# Patient Record
Sex: Female | Born: 1970 | Race: White | Hispanic: No | Marital: Married | Smoking: Former smoker
Health system: Southern US, Community
[De-identification: ages and names within clinical notes are randomized; demographics above are authoritative.]

## PROBLEM LIST (undated history)

## (undated) DIAGNOSIS — I2510 Atherosclerotic heart disease of native coronary artery without angina pectoris: Secondary | ICD-10-CM

## (undated) DIAGNOSIS — K219 Gastro-esophageal reflux disease without esophagitis: Secondary | ICD-10-CM

## (undated) DIAGNOSIS — G4733 Obstructive sleep apnea (adult) (pediatric): Principal | ICD-10-CM

## (undated) DIAGNOSIS — E785 Hyperlipidemia, unspecified: Secondary | ICD-10-CM

## (undated) HISTORY — DX: Hyperlipidemia, unspecified: E78.5

## (undated) HISTORY — DX: Morbid (severe) obesity due to excess calories: E66.01

## (undated) HISTORY — DX: Atherosclerotic heart disease of native coronary artery without angina pectoris: I25.10

## (undated) HISTORY — PX: OTHER SURGICAL HISTORY: SHX169

## (undated) HISTORY — DX: Obstructive sleep apnea (adult) (pediatric): G47.33

---

## 1997-12-05 ENCOUNTER — Other Ambulatory Visit: Admission: RE | Admit: 1997-12-05 | Discharge: 1997-12-05 | Payer: Self-pay | Admitting: Obstetrics and Gynecology

## 1999-04-29 ENCOUNTER — Other Ambulatory Visit: Admission: RE | Admit: 1999-04-29 | Discharge: 1999-04-29 | Payer: Self-pay | Admitting: Obstetrics and Gynecology

## 2000-05-04 ENCOUNTER — Other Ambulatory Visit: Admission: RE | Admit: 2000-05-04 | Discharge: 2000-05-04 | Payer: Self-pay | Admitting: Obstetrics and Gynecology

## 2000-05-22 ENCOUNTER — Emergency Department (HOSPITAL_COMMUNITY): Admission: EM | Admit: 2000-05-22 | Discharge: 2000-05-22 | Payer: Self-pay | Admitting: Emergency Medicine

## 2000-05-24 ENCOUNTER — Emergency Department (HOSPITAL_COMMUNITY): Admission: EM | Admit: 2000-05-24 | Discharge: 2000-05-24 | Payer: Self-pay | Admitting: Emergency Medicine

## 2000-08-25 ENCOUNTER — Encounter: Payer: Self-pay | Admitting: Family Medicine

## 2000-08-25 ENCOUNTER — Encounter: Admission: RE | Admit: 2000-08-25 | Discharge: 2000-08-25 | Payer: Self-pay | Admitting: Family Medicine

## 2001-08-29 ENCOUNTER — Other Ambulatory Visit: Admission: RE | Admit: 2001-08-29 | Discharge: 2001-08-29 | Payer: Self-pay | Admitting: Obstetrics and Gynecology

## 2002-02-25 ENCOUNTER — Emergency Department (HOSPITAL_COMMUNITY): Admission: EM | Admit: 2002-02-25 | Discharge: 2002-02-25 | Payer: Self-pay | Admitting: Emergency Medicine

## 2002-09-14 ENCOUNTER — Other Ambulatory Visit: Admission: RE | Admit: 2002-09-14 | Discharge: 2002-09-14 | Payer: Self-pay | Admitting: Obstetrics and Gynecology

## 2003-07-18 ENCOUNTER — Encounter: Admission: RE | Admit: 2003-07-18 | Discharge: 2003-07-18 | Payer: Self-pay | Admitting: Family Medicine

## 2003-10-23 ENCOUNTER — Other Ambulatory Visit: Admission: RE | Admit: 2003-10-23 | Discharge: 2003-10-23 | Payer: Self-pay | Admitting: Obstetrics and Gynecology

## 2004-10-24 ENCOUNTER — Other Ambulatory Visit: Admission: RE | Admit: 2004-10-24 | Discharge: 2004-10-24 | Payer: Self-pay | Admitting: Obstetrics and Gynecology

## 2005-12-03 ENCOUNTER — Other Ambulatory Visit: Admission: RE | Admit: 2005-12-03 | Discharge: 2005-12-03 | Payer: Self-pay | Admitting: Obstetrics and Gynecology

## 2010-04-04 ENCOUNTER — Encounter: Admission: RE | Admit: 2010-04-04 | Discharge: 2010-04-04 | Payer: Self-pay | Admitting: Otolaryngology

## 2012-09-08 ENCOUNTER — Encounter: Payer: Self-pay | Admitting: *Deleted

## 2012-09-08 ENCOUNTER — Other Ambulatory Visit: Payer: Self-pay | Admitting: *Deleted

## 2012-09-09 ENCOUNTER — Encounter: Payer: Self-pay | Admitting: Cardiovascular Disease

## 2012-09-09 ENCOUNTER — Ambulatory Visit (INDEPENDENT_AMBULATORY_CARE_PROVIDER_SITE_OTHER): Payer: BC Managed Care – PPO | Admitting: Cardiovascular Disease

## 2012-09-09 VITALS — BP 120/82 | HR 99 | Ht 64.0 in | Wt 190.0 lb

## 2012-09-09 DIAGNOSIS — R0600 Dyspnea, unspecified: Secondary | ICD-10-CM | POA: Insufficient documentation

## 2012-09-09 DIAGNOSIS — R0609 Other forms of dyspnea: Secondary | ICD-10-CM

## 2012-09-09 DIAGNOSIS — R079 Chest pain, unspecified: Secondary | ICD-10-CM

## 2012-09-09 DIAGNOSIS — R0789 Other chest pain: Secondary | ICD-10-CM

## 2012-09-09 NOTE — Progress Notes (Signed)
    [name] Date of Birth  02-14-1971       [facility]    [place] [address], [address]  [address] [place], [place]  [address]   [place], [place]  [address] [phone]     [phone]   Fax  [phone]    Fax [phone]  Problem List: 1. Chest pain 2.  Hyperlipidemia   History of Present Illness:  Debroah is a 42 yo with a strong family hx of CAD.  She has had some vague chest pain - lasts a week or so .  She also has some breast tenderness.   She does not get any exercise.  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    She does go hunting occasionally with her husband and never has any problems walking miles.   She denies any dyspnea.   She smokes a ppd.   She is interested in getting an Teaching laboratory technician.    She has occasional episodes of nausea and abdominal pain - she thinks she may have gall bladder problems.    Current Outpatient Prescriptions on File Prior to Visit  Medication Sig Dispense Refill  . fluticasone (FLONASE) 50 MCG/ACT nasal spray Place 1 spray into the nose daily.      Marland Kitchen NEXIUM 40 MG capsule Take 1 tablet daily in the morning      . simvastatin (ZOCOR) 40 MG tablet Take 1 tablet by mouth daily        No Known Allergies  Past Medical History  Diagnosis Date  . Hyperlipidemia     No past surgical history on file.  History  Smoking status  . Current Some Day Smoker  Smokeless tobacco  . Not on file    History  Alcohol Use No    No family history on file.  Reviw of Systems:  Reviewed in the HPI.  All other systems are negative.  Physical Exam: Blood pressure 120/82, pulse 99, height 5\' 4"  (1.626 m), weight 190 lb (86.183 kg), SpO2 98.00%.  General: Well developed, well nourished, in no acute distress.  Head: Normocephalic, atraumatic, sclera non-icteric, mucus membranes are moist,   Neck: Supple. Carotids are 2 + without bruits. No JVD   Lungs: Clear   Heart: RR,  normal S1, S2  Abdomen: Soft, non-tender, non-distended with normal bowel sounds.  Msk:  Strength and tone are normal   Extremities: No clubbing or cyanosis. No edema.  Distal pedal pulses are 2+ and equal    Neuro: CN II - XII intact.  Alert and oriented X 3.   Psych:  Normal   ECG: 09/09/2012: Normal sinus rhythm at 90 beats a minute. She has low voltage. She has nonspecific T-wave abnormalities.  Assessment / Plan:

## 2012-09-09 NOTE — Patient Instructions (Addendum)
Your physician recommends that you schedule a follow-up appointment in: As Needed  Your physician has requested that you have an echocardiogram. Echocardiography is a painless test that uses sound waves to create images of your heart. It provides your doctor with information about the size and shape of your heart and how well your heart's chambers and valves are working. This procedure takes approximately one hour. There are no restrictions for this procedure.  Calorie Counting Diet A calorie counting diet requires you to eat the number of calories that are right for you in a day. Calories are the measurement of how much energy you get from the food you eat. Eating the right amount of calories is important for staying at a healthy weight. If you eat too many calories, your body will store them as fat and you may gain weight. If you eat too few calories, you may lose weight. Counting the number of calories you eat during a day will help you know if you are eating the right amount. A Registered Dietitian can determine how many calories you need in a day. The amount of calories needed varies from person to person. If your goal is to lose weight, you will need to eat fewer calories. Losing weight can benefit you if you are overweight or have health problems such as heart disease, high blood pressure, or diabetes. If your goal is to gain weight, you will need to eat more calories. Gaining weight may be necessary if you have a certain health problem that causes your body to need more energy. TIPS Whether you are increasing or decreasing the number of calories you eat during a day, it may be hard to get used to changes in what you eat and drink. The following are tips to help you keep track of the number of calories you eat.  Measure foods at home with measuring cups. This helps you know the amount of food and number of calories you are eating.  Restaurants often serve food in amounts that are larger than 1  serving. While eating out, estimate how many servings of a food you are given. For example, a serving of cooked rice is  cup or about the size of half of a fist. Knowing serving sizes will help you be aware of how much food you are eating at restaurants.  Ask for smaller portion sizes or child-size portions at restaurants.  Plan to eat half of a meal at a restaurant. Take the rest home or share the other half with a friend.  Read the Nutrition Facts panel on food labels for calorie content and serving size. You can find out how many servings are in a package, the size of a serving, and the number of calories each serving has.  For example, a package might contain 3 cookies. The Nutrition Facts panel on that package says that 1 serving is 1 cookie. Below that, it will say there are 3 servings in the container. The calories section of the Nutrition Facts label says there are 90 calories. This means there are 90 calories in 1 cookie (1 serving). If you eat 1 cookie you have eaten 90 calories. If you eat all 3 cookies, you have eaten 270 calories (3 servings x 90 calories = 270 calories). The list below tells you how big or small some common portion sizes are.  1 oz.........4 stacked dice.  3 oz........Marland KitchenDeck of cards.  1 tsp.......Marland KitchenTip of little finger.  1 tbs......Marland KitchenMarland KitchenThumb.  2 tbs.......Marland KitchenGolf ball.  cup......Marland KitchenHalf of a fist.  1 cup.......Marland KitchenA fist. KEEP A FOOD LOG Write down every food item you eat, the amount you eat, and the number of calories in each food you eat during the day. At the end of the day, you can add up the total number of calories you have eaten. It may help to keep a list like the one below. Find out the calorie information by reading the Nutrition Facts panel on food labels. Breakfast  Bran cereal (1 cup, 110 calories).  Fat-free milk ( cup, 45 calories). Snack  Apple (1 medium, 80 calories). Lunch  Spinach (1 cup, 20 calories).  Tomato ( medium, 20  calories).  Chicken breast strips (3 oz, 165 calories).  Shredded cheddar cheese ( cup, 110 calories).  Light [place] dressing (2 tbs, 60 calories).  Whole-wheat bread (1 slice, 80 calories).  Tub margarine (1 tsp, 35 calories).  Vegetable soup (1 cup, 160 calories). Dinner  Pork chop (3 oz, 190 calories).  Brown rice (1 cup, 215 calories).  Steamed broccoli ( cup, 20 calories).  Strawberries (1  cup, 65 calories).  Whipped cream (1 tbs, 50 calories). Daily Calorie Total: 1425 Document Released: 08/10/2005 Document Revised: 11/02/2011 Document Reviewed: 02/04/2007 [facility] Patient Information 2013 [place], [place].   Exercise to Lose Weight Exercise and a healthy diet may help you lose weight. Your doctor may suggest specific exercises. EXERCISE IDEAS AND TIPS  Choose low-cost things you enjoy doing, such as walking, bicycling, or exercising to workout videos.  Take stairs instead of the elevator.  Walk during your lunch break.  Park your car further away from work or school.  Go to a gym or an exercise class.  Start with 5 to 10 minutes of exercise each day. Build up to 30 minutes of exercise 4 to 6 days a week.  Wear shoes with good support and comfortable clothes.  Stretch before and after working out.  Work out until you breathe harder and your heart beats faster.  Drink extra water when you exercise.  Do not do so much that you hurt yourself, feel dizzy, or get very short of breath. Exercises that burn about 150 calories:  Running 1  miles in 15 minutes.  Playing volleyball for 45 to 60 minutes.  Washing and waxing a car for 45 to 60 minutes.  Playing touch football for 45 minutes.  Walking 1  miles in 35 minutes.  Pushing a stroller 1  miles in 30 minutes.  Playing basketball for 30 minutes.  Raking leaves for 30 minutes.  Bicycling 5 miles in 30 minutes.  Walking 2 miles in 30 minutes.  Dancing for 30 minutes.  Shoveling snow  for 15 minutes.  Swimming laps for 20 minutes.  Walking up stairs for 15 minutes.  Bicycling 4 miles in 15 minutes.  Gardening for 30 to 45 minutes.  Jumping rope for 15 minutes.  Washing windows or floors for 45 to 60 minutes. Document Released: 09/12/2010 Document Revised: 11/02/2011 Document Reviewed: 09/12/2010 [facility] Patient Information 2013 [place], [place].

## 2012-09-09 NOTE — Assessment & Plan Note (Signed)
Adrienne [name] presents with some atypical episodes episodes of chest wall pain. She has thought that it may be due to her breasts. She has occasional episodes of chest pain at rest but she never has episodes of pain when she is walking or hiking. She does deer hunting with her husband on occasion and never has any problems with chest pain.  Her EKG is basically unremarkable. I don't think that she has ischemic heart disease.  I have advised her to stop smoking as this will help reduce the risk of any further problems. Her medical Dr. will be measuring her cholesterol a regular basis

## 2012-09-09 NOTE — Assessment & Plan Note (Signed)
She presents with some episodes of dyspnea. I think that an echocardiogram may be helpful in evaluating her left ventricular size and function.  We will not schedule her a return appointment but will be happy to see her again in the future if needed.

## 2012-09-16 ENCOUNTER — Ambulatory Visit (HOSPITAL_COMMUNITY): Payer: BC Managed Care – PPO | Attending: Cardiology | Admitting: Radiology

## 2012-09-16 DIAGNOSIS — R072 Precordial pain: Secondary | ICD-10-CM

## 2012-09-16 DIAGNOSIS — R0600 Dyspnea, unspecified: Secondary | ICD-10-CM

## 2012-09-16 DIAGNOSIS — F17200 Nicotine dependence, unspecified, uncomplicated: Secondary | ICD-10-CM | POA: Insufficient documentation

## 2012-09-16 DIAGNOSIS — R079 Chest pain, unspecified: Secondary | ICD-10-CM

## 2012-09-16 NOTE — Progress Notes (Signed)
Echocardiogram performed.  

## 2017-11-24 ENCOUNTER — Other Ambulatory Visit: Payer: Self-pay | Admitting: Nurse Practitioner

## 2017-11-24 DIAGNOSIS — Z1231 Encounter for screening mammogram for malignant neoplasm of breast: Secondary | ICD-10-CM

## 2017-12-16 ENCOUNTER — Ambulatory Visit: Payer: Self-pay

## 2017-12-20 ENCOUNTER — Ambulatory Visit
Admission: RE | Admit: 2017-12-20 | Discharge: 2017-12-20 | Disposition: A | Discharge status: Home / Self Care | Payer: BLUE CROSS/BLUE SHIELD | Source: Ambulatory Visit | Attending: Nurse Practitioner | Admitting: Nurse Practitioner

## 2017-12-20 DIAGNOSIS — Z1231 Encounter for screening mammogram for malignant neoplasm of breast: Secondary | ICD-10-CM

## 2018-02-22 ENCOUNTER — Emergency Department (HOSPITAL_COMMUNITY): Payer: BLUE CROSS/BLUE SHIELD

## 2018-02-22 ENCOUNTER — Encounter (HOSPITAL_COMMUNITY): Payer: Self-pay | Admitting: Emergency Medicine

## 2018-02-22 ENCOUNTER — Other Ambulatory Visit: Payer: Self-pay

## 2018-02-22 ENCOUNTER — Inpatient Hospital Stay (HOSPITAL_COMMUNITY)
Admission: EM | Admit: 2018-02-22 | Discharge: 2018-02-26 | DRG: 247 | Disposition: A | Discharge status: Home / Self Care | Payer: BLUE CROSS/BLUE SHIELD | Attending: Internal Medicine | Admitting: Internal Medicine

## 2018-02-22 DIAGNOSIS — Z8249 Family history of ischemic heart disease and other diseases of the circulatory system: Secondary | ICD-10-CM

## 2018-02-22 DIAGNOSIS — F17200 Nicotine dependence, unspecified, uncomplicated: Secondary | ICD-10-CM | POA: Diagnosis present

## 2018-02-22 DIAGNOSIS — I214 Non-ST elevation (NSTEMI) myocardial infarction: Secondary | ICD-10-CM

## 2018-02-22 DIAGNOSIS — I2510 Atherosclerotic heart disease of native coronary artery without angina pectoris: Secondary | ICD-10-CM | POA: Diagnosis present

## 2018-02-22 DIAGNOSIS — Z7982 Long term (current) use of aspirin: Secondary | ICD-10-CM

## 2018-02-22 DIAGNOSIS — R079 Chest pain, unspecified: Secondary | ICD-10-CM | POA: Diagnosis present

## 2018-02-22 DIAGNOSIS — Z955 Presence of coronary angioplasty implant and graft: Secondary | ICD-10-CM

## 2018-02-22 DIAGNOSIS — R072 Precordial pain: Secondary | ICD-10-CM | POA: Diagnosis not present

## 2018-02-22 DIAGNOSIS — Z881 Allergy status to other antibiotic agents status: Secondary | ICD-10-CM

## 2018-02-22 DIAGNOSIS — Z880 Allergy status to penicillin: Secondary | ICD-10-CM

## 2018-02-22 DIAGNOSIS — E785 Hyperlipidemia, unspecified: Secondary | ICD-10-CM | POA: Diagnosis present

## 2018-02-22 DIAGNOSIS — E876 Hypokalemia: Secondary | ICD-10-CM | POA: Diagnosis present

## 2018-02-22 DIAGNOSIS — Z7951 Long term (current) use of inhaled steroids: Secondary | ICD-10-CM

## 2018-02-22 DIAGNOSIS — K219 Gastro-esophageal reflux disease without esophagitis: Secondary | ICD-10-CM | POA: Diagnosis present

## 2018-02-22 DIAGNOSIS — Z79899 Other long term (current) drug therapy: Secondary | ICD-10-CM

## 2018-02-22 DIAGNOSIS — I249 Acute ischemic heart disease, unspecified: Secondary | ICD-10-CM

## 2018-02-22 DIAGNOSIS — Z91040 Latex allergy status: Secondary | ICD-10-CM

## 2018-02-22 DIAGNOSIS — Z6841 Body Mass Index (BMI) 40.0 and over, adult: Secondary | ICD-10-CM

## 2018-02-22 HISTORY — DX: Gastro-esophageal reflux disease without esophagitis: K21.9

## 2018-02-22 HISTORY — DX: Atherosclerotic heart disease of native coronary artery without angina pectoris: I25.10

## 2018-02-22 LAB — I-STAT TROPONIN, ED: TROPONIN I, POC: 0.08 ng/mL (ref 0.00–0.08)

## 2018-02-22 LAB — I-STAT BETA HCG BLOOD, ED (MC, WL, AP ONLY): I-stat hCG, quantitative: <5 m[IU]/mL (ref <5)

## 2018-02-22 NOTE — ED Triage Notes (Signed)
Pt arrived via EMS with c/o chest pain x 4 hours tonight. Similar episode Sunday after a "heated discussion" with her husband. Pt hx GERD, took xanax and gerd meds PTA as well as aspirin, refused NTG. VSS.

## 2018-02-22 NOTE — ED Notes (Signed)
Patient transported to X-ray 

## 2018-02-23 ENCOUNTER — Inpatient Hospital Stay (HOSPITAL_COMMUNITY)
Admission: EM | Disposition: A | Discharge status: Home / Self Care | Payer: Self-pay | Source: Home / Self Care | Attending: Internal Medicine

## 2018-02-23 ENCOUNTER — Other Ambulatory Visit: Payer: Self-pay

## 2018-02-23 ENCOUNTER — Observation Stay (HOSPITAL_BASED_OUTPATIENT_CLINIC_OR_DEPARTMENT_OTHER): Payer: BLUE CROSS/BLUE SHIELD

## 2018-02-23 ENCOUNTER — Encounter (HOSPITAL_COMMUNITY): Payer: Self-pay | Admitting: Internal Medicine

## 2018-02-23 DIAGNOSIS — I2510 Atherosclerotic heart disease of native coronary artery without angina pectoris: Secondary | ICD-10-CM | POA: Diagnosis not present

## 2018-02-23 DIAGNOSIS — E785 Hyperlipidemia, unspecified: Secondary | ICD-10-CM

## 2018-02-23 DIAGNOSIS — K219 Gastro-esophageal reflux disease without esophagitis: Secondary | ICD-10-CM | POA: Diagnosis present

## 2018-02-23 DIAGNOSIS — R079 Chest pain, unspecified: Secondary | ICD-10-CM | POA: Diagnosis not present

## 2018-02-23 DIAGNOSIS — I214 Non-ST elevation (NSTEMI) myocardial infarction: Principal | ICD-10-CM

## 2018-02-23 DIAGNOSIS — I249 Acute ischemic heart disease, unspecified: Secondary | ICD-10-CM

## 2018-02-23 DIAGNOSIS — Z720 Tobacco use: Secondary | ICD-10-CM

## 2018-02-23 DIAGNOSIS — E876 Hypokalemia: Secondary | ICD-10-CM | POA: Diagnosis not present

## 2018-02-23 HISTORY — PX: LEFT HEART CATH AND CORONARY ANGIOGRAPHY: CATH118249

## 2018-02-23 LAB — BASIC METABOLIC PANEL
Anion gap: 7 (ref 5–15)
BUN: 7 mg/dL (ref 6–20)
CALCIUM: 8.5 mg/dL — AB (ref 8.9–10.3)
CO2: 25 mmol/L (ref 22–32)
Chloride: 108 mmol/L (ref 98–111)
Creatinine, Ser: 0.65 mg/dL (ref 0.44–1.00)
GFR calc Af Amer: >60 mL/min (ref >60)
GLUCOSE: 114 mg/dL — AB (ref 70–99)
Potassium: 3.7 mmol/L (ref 3.5–5.1)
Sodium: 140 mmol/L (ref 135–145)

## 2018-02-23 LAB — PROTIME-INR
INR: 1
Prothrombin Time: 13.1 seconds (ref 11.4–15.2)

## 2018-02-23 LAB — I-STAT TROPONIN, ED: TROPONIN I, POC: 0.56 ng/mL — AB (ref 0.00–0.08)

## 2018-02-23 LAB — CBC
HCT: 42.8 % (ref 36.0–46.0)
HEMATOCRIT: 40.5 % (ref 36.0–46.0)
HEMOGLOBIN: 13.2 g/dL (ref 12.0–15.0)
Hemoglobin: 13.8 g/dL (ref 12.0–15.0)
MCH: 31.2 pg (ref 26.0–34.0)
MCH: 31.2 pg (ref 26.0–34.0)
MCHC: 32.2 g/dL (ref 30.0–36.0)
MCHC: 32.6 g/dL (ref 30.0–36.0)
MCV: 96.8 fL (ref 78.0–100.0)
MCV: 95.7 fL (ref 78.0–100.0)
PLATELETS: 211 10*3/uL (ref 150–400)
Platelets: 220 10*3/uL (ref 150–400)
RBC: 4.42 MIL/uL (ref 3.87–5.11)
RBC: 4.23 MIL/uL (ref 3.87–5.11)
RDW: 14.2 % (ref 11.5–15.5)
RDW: 14 % (ref 11.5–15.5)
WBC: 9.6 10*3/uL (ref 4.0–10.5)
WBC: 9.6 10*3/uL (ref 4.0–10.5)

## 2018-02-23 LAB — CREATININE, SERUM: Creatinine, Ser: 0.62 mg/dL (ref 0.44–1.00)

## 2018-02-23 LAB — TROPONIN I
TROPONIN I: 0.7 ng/mL — AB (ref <0.03)
TROPONIN I: 1.23 ng/mL — AB (ref <0.03)
Troponin I: 0.7 ng/mL (ref <0.03)

## 2018-02-23 LAB — ECHOCARDIOGRAM COMPLETE
Height: 64 in
Weight: 3830.4 oz

## 2018-02-23 LAB — HIV ANTIBODY (ROUTINE TESTING W REFLEX): HIV Screen 4th Generation wRfx: NONREACTIVE

## 2018-02-23 SURGERY — LEFT HEART CATH AND CORONARY ANGIOGRAPHY
Anesthesia: LOCAL

## 2018-02-23 MED ORDER — METOPROLOL TARTRATE 25 MG PO TABS
25.0000 mg | ORAL_TABLET | Freq: Two times a day (BID) | ORAL | Status: DC
  Administered 2018-02-23 – 2018-02-24 (×2): 25 mg via ORAL
  Filled 2018-02-23 (×6): qty 1

## 2018-02-23 MED ORDER — SODIUM CHLORIDE 0.9 % IV SOLN: 250.0000 mL | INTRAVENOUS | Status: DC | PRN

## 2018-02-23 MED ORDER — ASPIRIN EC 325 MG PO TBEC
325.0000 mg | DELAYED_RELEASE_TABLET | Freq: Every day | ORAL | Status: DC
  Administered 2018-02-23: 325 mg via ORAL
  Filled 2018-02-23: qty 1

## 2018-02-23 MED ORDER — HEPARIN SODIUM (PORCINE) 1000 UNIT/ML IJ SOLN
INTRAMUSCULAR | Status: AC
  Filled 2018-02-23: qty 1

## 2018-02-23 MED ORDER — HEPARIN SODIUM (PORCINE) 1000 UNIT/ML IJ SOLN
INTRAMUSCULAR | Status: DC | PRN
  Administered 2018-02-23: 5500 [IU] via INTRAVENOUS

## 2018-02-23 MED ORDER — SODIUM CHLORIDE 0.9% FLUSH: 3.0000 mL | INTRAVENOUS | Status: DC | PRN

## 2018-02-23 MED ORDER — SODIUM CHLORIDE 0.9 % WEIGHT BASED INFUSION: 1.0000 mL/kg/h | INTRAVENOUS | Status: DC

## 2018-02-23 MED ORDER — PANTOPRAZOLE SODIUM 40 MG PO TBEC
40.0000 mg | DELAYED_RELEASE_TABLET | Freq: Every day | ORAL | Status: DC
  Administered 2018-02-23 – 2018-02-26 (×4): 40 mg via ORAL
  Filled 2018-02-23 (×4): qty 1

## 2018-02-23 MED ORDER — ENOXAPARIN SODIUM 40 MG/0.4ML ~~LOC~~ SOLN
40.0000 mg | SUBCUTANEOUS | Status: DC
  Filled 2018-02-23: qty 0.4

## 2018-02-23 MED ORDER — ESCITALOPRAM OXALATE 10 MG PO TABS
20.0000 mg | ORAL_TABLET | Freq: Every day | ORAL | Status: DC
Start: 2018-02-23 — End: 2018-02-24
  Administered 2018-02-23: 10 mg via ORAL
  Filled 2018-02-23 (×2): qty 2

## 2018-02-23 MED ORDER — SODIUM CHLORIDE 0.9 % WEIGHT BASED INFUSION
3.0000 mL/kg/h | INTRAVENOUS | Status: DC
  Administered 2018-02-23: 3 mL/kg/h via INTRAVENOUS

## 2018-02-23 MED ORDER — SODIUM CHLORIDE 0.9% FLUSH
3.0000 mL | Freq: Two times a day (BID) | INTRAVENOUS | Status: DC
  Administered 2018-02-23 – 2018-02-24 (×2): 3 mL via INTRAVENOUS

## 2018-02-23 MED ORDER — VERAPAMIL HCL 2.5 MG/ML IV SOLN
INTRAVENOUS | Status: AC
  Filled 2018-02-23: qty 2

## 2018-02-23 MED ORDER — FENTANYL CITRATE (PF) 100 MCG/2ML IJ SOLN
INTRAMUSCULAR | Status: AC
  Filled 2018-02-23: qty 2

## 2018-02-23 MED ORDER — ACETAMINOPHEN 325 MG PO TABS
650.0000 mg | ORAL_TABLET | ORAL | Status: DC | PRN
  Administered 2018-02-25: 650 mg via ORAL
  Filled 2018-02-23: qty 2

## 2018-02-23 MED ORDER — LIDOCAINE HCL (PF) 1 % IJ SOLN
INTRAMUSCULAR | Status: DC | PRN
  Administered 2018-02-23: 2 mL

## 2018-02-23 MED ORDER — TICAGRELOR 90 MG PO TABS
90.0000 mg | ORAL_TABLET | Freq: Two times a day (BID) | ORAL | Status: DC
  Administered 2018-02-24 – 2018-02-26 (×5): 90 mg via ORAL
  Filled 2018-02-23 (×5): qty 1

## 2018-02-23 MED ORDER — MIDAZOLAM HCL 2 MG/2ML IJ SOLN
INTRAMUSCULAR | Status: DC | PRN
  Administered 2018-02-23 (×2): 1 mg via INTRAVENOUS

## 2018-02-23 MED ORDER — SODIUM CHLORIDE 0.9 % IV SOLN
INTRAVENOUS | Status: AC
  Administered 2018-02-23: 20:00:00 via INTRAVENOUS

## 2018-02-23 MED ORDER — LIDOCAINE HCL (PF) 1 % IJ SOLN
INTRAMUSCULAR | Status: AC
  Filled 2018-02-23: qty 30

## 2018-02-23 MED ORDER — SIMVASTATIN 40 MG PO TABS
40.0000 mg | ORAL_TABLET | Freq: Every day | ORAL | Status: DC
  Administered 2018-02-23 – 2018-02-25 (×3): 40 mg via ORAL
  Filled 2018-02-23 (×3): qty 1

## 2018-02-23 MED ORDER — ALPRAZOLAM 0.5 MG PO TABS
0.5000 mg | ORAL_TABLET | Freq: Three times a day (TID) | ORAL | Status: DC | PRN
  Administered 2018-02-23: 1 mg via ORAL
  Administered 2018-02-24: 0.5 mg via ORAL
  Administered 2018-02-24 – 2018-02-26 (×4): 1 mg via ORAL
  Filled 2018-02-23 (×8): qty 2

## 2018-02-23 MED ORDER — ASPIRIN 81 MG PO CHEW: 81.0000 mg | CHEWABLE_TABLET | ORAL | Status: DC

## 2018-02-23 MED ORDER — CLOPIDOGREL BISULFATE 75 MG PO TABS: 75.0000 mg | ORAL_TABLET | Freq: Every day | ORAL | Status: DC

## 2018-02-23 MED ORDER — VERAPAMIL HCL 2.5 MG/ML IV SOLN
INTRAVENOUS | Status: DC | PRN
  Administered 2018-02-23: 10 mL via INTRA_ARTERIAL

## 2018-02-23 MED ORDER — HEPARIN (PORCINE) IN NACL 2-0.9 UNITS/ML
INTRAMUSCULAR | Status: AC | PRN
  Administered 2018-02-23 (×2): 500 mL

## 2018-02-23 MED ORDER — ONDANSETRON HCL 4 MG/2ML IJ SOLN: 4.0000 mg | Freq: Four times a day (QID) | INTRAMUSCULAR | Status: DC | PRN

## 2018-02-23 MED ORDER — TICAGRELOR 90 MG PO TABS
180.0000 mg | ORAL_TABLET | Freq: Once | ORAL | Status: AC
  Administered 2018-02-23: 180 mg via ORAL
  Filled 2018-02-23: qty 2

## 2018-02-23 MED ORDER — MIDAZOLAM HCL 2 MG/2ML IJ SOLN
INTRAMUSCULAR | Status: AC
  Filled 2018-02-23: qty 2

## 2018-02-23 MED ORDER — IOHEXOL 350 MG/ML SOLN
INTRAVENOUS | Status: DC | PRN
  Administered 2018-02-23: 85 mL

## 2018-02-23 MED ORDER — FENTANYL CITRATE (PF) 100 MCG/2ML IJ SOLN
INTRAMUSCULAR | Status: DC | PRN
  Administered 2018-02-23 (×2): 25 ug via INTRAVENOUS
  Administered 2018-02-23: 50 ug via INTRAVENOUS

## 2018-02-23 MED ORDER — ASPIRIN EC 81 MG PO TBEC
81.0000 mg | DELAYED_RELEASE_TABLET | Freq: Every day | ORAL | Status: DC
  Administered 2018-02-24 – 2018-02-26 (×2): 81 mg via ORAL
  Filled 2018-02-23 (×2): qty 1

## 2018-02-23 MED ORDER — CLOPIDOGREL BISULFATE 75 MG PO TABS: 600.0000 mg | ORAL_TABLET | Freq: Once | ORAL | Status: DC

## 2018-02-23 MED ORDER — HEPARIN (PORCINE) IN NACL 1000-0.9 UT/500ML-% IV SOLN
INTRAVENOUS | Status: AC
  Filled 2018-02-23: qty 1000

## 2018-02-23 MED ORDER — HEPARIN (PORCINE) IN NACL 100-0.45 UNIT/ML-% IJ SOLN
1250.0000 [IU]/h | INTRAMUSCULAR | Status: DC
  Administered 2018-02-23: 950 [IU]/h via INTRAVENOUS
  Administered 2018-02-24: 1250 [IU]/h via INTRAVENOUS
  Filled 2018-02-23 (×2): qty 250

## 2018-02-23 SURGICAL SUPPLY — 13 items
CATH 5FR JL3.5 JR4 ANG PIG MP (CATHETERS) ×2 IMPLANT
CATH INFINITI 5 FR 3DRC (CATHETERS) ×2 IMPLANT
COVER PRB 48X5XTLSCP FOLD TPE (BAG) ×1 IMPLANT
COVER PROBE 5X48 (BAG) ×2
DEVICE RAD COMP TR BAND LRG (VASCULAR PRODUCTS) ×2 IMPLANT
GLIDESHEATH SLEND SS 6F .021 (SHEATH) ×2 IMPLANT
GUIDEWIRE INQWIRE 1.5J.035X260 (WIRE) ×1 IMPLANT
INQWIRE 1.5J .035X260CM (WIRE) ×2
KIT HEART LEFT (KITS) ×2 IMPLANT
NEEDLE PERC 21GX4CM (NEEDLE) ×2 IMPLANT
PACK CARDIAC CATHETERIZATION (CUSTOM PROCEDURE TRAY) ×2 IMPLANT
TRANSDUCER W/STOPCOCK (MISCELLANEOUS) ×2 IMPLANT
TUBING CIL FLEX 10 FLL-RA (TUBING) ×2 IMPLANT

## 2018-02-23 NOTE — Progress Notes (Signed)
Pt ID band moved to left wrist, pt groin and R radial prepped, pt has 2 IVs placed, and consent signed placed in chart  Primary RN aware  Called cath lab, RN stated she is next  Pt and pt family aware

## 2018-02-23 NOTE — ED Notes (Signed)
ED Provider at bedside. 

## 2018-02-23 NOTE — ED Notes (Signed)
Per Dr. Toniann FailKakraKandy; cancel bed request. Patient is currently refusing admission.

## 2018-02-23 NOTE — Progress Notes (Signed)
Received patient back from catheterization.  Cath. Site level 0.  Patient is not having any chest pain, some numbness of R hand, but no pain, < 3 sec cap refill; warm to touch.  Provided education regarding keeping arm elevated above the heart and not using.

## 2018-02-23 NOTE — Consult Note (Signed)
 Cardiology Consult    Patient ID: Adrienne [name] MRN: [id number], DOB/AGE: 03/22/1971   Admit date: 02/22/2018 Date of Consult: 02/23/2018  Primary Physician: Badger, Michael C, MD Primary Cardiologist: New Requesting Provider: Dr. Mathews  Reason for Consultation: Chest pain with + trop   Adrienne [name] is a 47 y.o. female who is being seen today for the evaluation of chest pain and + trop at the request of Dr. Mathews.   Patient Profile    47 yo female with PMH of HL, GERD and tobacco use who presented with chest pain and found to have + trop.   Past Medical History   Past Medical History:  Diagnosis Date  . GERD (gastroesophageal reflux disease)   . Hyperlipidemia     Past Surgical History:  Procedure Laterality Date  . CESAREAN SECTION    . finger surgery       Allergies  Allergies  Allergen Reactions  . Ciprofloxacin Swelling    Tongue and lips swell  . Amoxicillin Hives  . Latex Itching and Rash    Latex allergy is to "condoms," but gloves are tolerated    History of Present Illness    Adrienne [name] is a 47 yo female with PMH of HL, GERD and tobacco use. Reports she is followed by her PCP for her cholesterol and last readings have been better controlled. Has been smoking for the last 25-30 years about 1ppd. Does have family hx of CAD with mother and father having MIs in their early 60s.   Reports she has been in her usual state of health until yesterday afternoon around 1pm. She was sitting getting her nails done when she developed centralized chest discomfort with radiation into her jaw. Symptoms lingered and she took a Xanax thinking this was related to her anxiety. Has GERD but reports this definitely felt different that her usual GERD symptoms. Ran some errands afterwards, in and out of several stores. Around 6pm symptoms progressed and she developed left arm pain with significant pain in her left wrist. Family became concerned and called EMS. She took  324mg ASA and actually began to feel better by the time EMS arrived. Offered nitro but stated she did not want to get a headache. By the time she arrived to the ED her symptoms had resolved.   Labs showed stable electrolytes, Hgb 13.8, POC trop 0.00>>0.56, with delta troponin 0.70. EKG showed SR with no acute ST/T wave abnormalities. She was actually wanting to leave, until her second troponin came back elevated.   Inpatient Medications    . aspirin EC  325 mg Oral Daily  . enoxaparin (LOVENOX) injection  40 mg Subcutaneous Q24H  . escitalopram  20 mg Oral Daily  . pantoprazole  40 mg Oral Daily  . simvastatin  40 mg Oral QHS    Family History    Family History  Problem Relation Age of Onset  . CAD Mother     Social History    Social History   Socioeconomic History  . Marital status: Married    Spouse name: Not on file  . Number of children: Not on file  . Years of education: Not on file  . Highest education level: Not on file  Occupational History  . Not on file  Social Needs  . Financial resource strain: Not on file  . Food insecurity:    Worry: Not on file    Inability: Not on file  . Transportation needs:      Medical: Not on file    Non-medical: Not on file  Tobacco Use  . Smoking status: Current Some Day Smoker  . Smokeless tobacco: Never Used  Substance and Sexual Activity  . Alcohol use: No  . Drug use: No  . Sexual activity: Not on file  Lifestyle  . Physical activity:    Days per week: Not on file    Minutes per session: Not on file  . Stress: Not on file  Relationships  . Social connections:    Talks on phone: Not on file    Gets together: Not on file    Attends religious service: Not on file    Active member of club or organization: Not on file    Attends meetings of clubs or organizations: Not on file    Relationship status: Not on file  . Intimate partner violence:    Fear of current or ex partner: Not on file    Emotionally abused: Not on  file    Physically abused: Not on file    Forced sexual activity: Not on file  Other Topics Concern  . Not on file  Social History Narrative  . Not on file     Review of Systems    See HPI  All other systems reviewed and are otherwise negative except as noted above.  Physical Exam    Blood pressure (!) 156/85, pulse 72, temperature 98 F (36.7 C), temperature source Oral, resp. rate 18, height 5' 4" (1.626 m), weight 239 lb 6.4 oz (108.6 kg), last menstrual period 02/01/2018, SpO2 95 %.  General: Pleasant, obese WF, NAD Psych: Normal affect. Neuro: Alert and oriented X 3. Moves all extremities spontaneously. HEENT: Normal  Neck: Supple without bruits or JVD. Lungs:  Resp regular and unlabored, CTA. Heart: RRR no s3, s4, or murmurs. Abdomen: Soft, non-tender, non-distended, BS + x 4.  Extremities: No clubbing, cyanosis or edema. DP/PT/Radials 2+ and equal bilaterally.  Labs    Troponin (Point of Care Test) Recent Labs    02/23/18 0214  TROPIPOC 0.56*   Recent Labs    02/23/18 0516  TROPONINI 0.70*   Lab Results  Component Value Date   WBC 9.6 02/23/2018   HGB 13.2 02/23/2018   HCT 40.5 02/23/2018   MCV 95.7 02/23/2018   PLT 220 02/23/2018    Recent Labs  Lab 02/22/18 2305 02/23/18 0516  NA 140  --   K 3.7  --   CL 108  --   CO2 25  --   BUN 7  --   CREATININE 0.65 0.62  CALCIUM 8.5*  --   GLUCOSE 114*  --    No results found for: CHOL, HDL, LDLCALC, TRIG No results found for: DDIMER   Radiology Studies    Dg Chest 2 View  Result Date: 02/22/2018 CLINICAL DATA:  Chest pain, left jaw pain, and left arm pain tonight. EXAM: CHEST - 2 VIEW COMPARISON:  None. FINDINGS: Normal heart size and pulmonary vascularity. No focal airspace disease or consolidation in the lungs. No blunting of costophrenic angles. No pneumothorax. Mediastinal contours appear intact. IMPRESSION: No active disease. Electronically Signed   By: William  Stevens M.D.   On: 02/22/2018  23:30    ECG & Cardiac Imaging    EKG:  The EKG was personally reviewed and demonstrates SR  Echo: pending  Assessment & Plan    47 yo female with PMH of HL, GERD and tobacco use who presented with chest pain and   found to have + trop.   1. NSTEMI: Symptoms yesterday are concerning for ACS. Has CRFs with long standing tobacco use, HL and family hx of CAD. Trop rising with last reading at 0.70. EKG shows SR with no acute ST/T wave changes. Will plan for cardiac cath today.  -- The patient understands that risks included but are not limited to stroke (1 in 1000), death (1 in 1000), kidney failure [usually temporary] (1 in 500), bleeding (1 in 200), allergic reaction [possibly serious] (1 in 200).   2. HL: followed by PCP for the same. Has been on Zocor, states her last readings have been better controlled. In review of care everywhere last LDL 4/19 was 93.   3. GERD: on PPI  4. Tobacco use: cessation discussed.   Signed, Adrienne Karman, NP-C Pager [phone] 02/23/2018, 11:06 AM  

## 2018-02-23 NOTE — Progress Notes (Signed)
ANTICOAGULATION CONSULT NOTE  Pharmacy Consult for heparin Indication: chest pain/ACS  Heparin Dosing Weight: 80.4 kg  Labs: Recent Labs    02/22/18 2305 02/23/18 0516 02/23/18 1030 02/23/18 1242  HGB 13.8 13.2  --   --   HCT 42.8 40.5  --   --   PLT 211 220  --   --   LABPROT  --   --   --  13.1  INR  --   --   --  1.00  CREATININE 0.65 0.62  --   --   TROPONINI  --  0.70* 1.23*  --     Assessment: [age] yof presenting with NSTEMI, now s/p cath 7/3. Unable to access RCA lesion from radial approach and will need PCI of RCA from groin approach on 7/5. Pharmacy consulted to start heparin at 2300 per Cards (~6hrs post-sheath removal) - sheath removed at 1715 per cath procedure log. Not on anticoagulation PTA. CBC wnl. No bleed documented.  Goal of Therapy:  Heparin level 0.3-0.7 units/ml Monitor platelets by anticoagulation protocol: Yes   Plan:  Start heparin at 950 units/hr (no bolus) at 2300 per Cards (~6hrs post-sheath removal) 6h heparin level Monitor daily heparin level and CBC, s/sx bleeding PCI of RCA scheduled for 7/5  Adrienne BertinHaley [name] Adrienne [name], PharmD, BCPS Clinical Pharmacist 02/23/2018 5:42 PM

## 2018-02-23 NOTE — Progress Notes (Signed)
CRITICAL VALUE ALERT  Critical Value: Troponin 0.70  Date & Time Notied:  02/23/2018 at 0700  Provider Notified: MD made aware  Waiting for cardiology per MD

## 2018-02-23 NOTE — Interval H&P Note (Signed)
History and Physical Interval Note:  02/23/2018 4:02 PM  Adrienne [name]  has presented today for cardiac cath with the diagnosis of NSTEMI. The various methods of treatment have been discussed with the patient and family. After consideration of risks, benefits and other options for treatment, the patient has consented to  Procedure(s): LEFT HEART CATH AND CORONARY ANGIOGRAPHY (N/A) as a surgical intervention .  The patient's history has been reviewed, patient examined, no change in status, stable for surgery.  I have reviewed the patient's chart and labs.  Questions were answered to the patient's satisfaction.    Cath Lab Visit (complete for each Cath Lab visit)  Clinical Evaluation Leading to the Procedure:   ACS: Yes.    Non-ACS:    Anginal Classification: CCS III  Anti-ischemic medical therapy: No Therapy  Non-Invasive Test Results: No non-invasive testing performed  Prior CABG: No previous CABG         Verne Carrowhristopher Rawan Riendeau

## 2018-02-23 NOTE — ED Notes (Signed)
Paged Toniann FailKakrakandy to [place], [place]

## 2018-02-23 NOTE — ED Provider Notes (Addendum)
[facility] Provider Note   CSN: [id number] Arrival date & time: 02/22/18  2244     History   Chief Complaint Chief Complaint  Patient presents with  . Chest Pain    HPI Oneita KrasHeather L Binns is a 47 y.o. female.  HPI  This is a 47 year old female with a history of reflux and hyperlipidemia who presents with chest pain.  Patient reports 4 hours of constant chest pain.  She states there was anterior there was some burning nature but it radiated up into her throat and her left jaw.  It also radiated into her left arm.  She had some similar pain this morning.  No true improvement with Zantac.  Patient states that she had some recurrent pain this afternoon that was worsened while she was running errands and "running around like crazy."  She denies any recent fevers, shortness of breath, diaphoresis.  No history of blood clots.  Not on any estrogen supplementation.  Does have early family history of heart disease and she is a smoker.  She also had an episode on Sunday after having an argument with her husband.  Past Medical History:  Diagnosis Date  . GERD (gastroesophageal reflux disease)   . Hyperlipidemia     Patient Active Problem List   Diagnosis Date Noted  . Chest pain 02/23/2018  . Chest tightness 09/09/2012  . Dyspnea 09/09/2012    History reviewed. No pertinent surgical history.   OB History   None      Home Medications    Prior to Admission medications   Medication Sig Start Date End Date Taking? Authorizing Provider  albuterol (PROAIR HFA) 108 (90 Base) MCG/ACT inhaler Inhale 1-2 puffs into the lungs every 6 (six) hours as needed for wheezing or shortness of breath.   Yes [provider]  ALPRAZolam Prudy Feeler(XANAX) 1 MG tablet Take 0.5-1 mg by mouth 3 (three) times daily as needed for anxiety.  02/22/18  Yes [provider]  aspirin 81 MG tablet Take 81 mg by mouth daily.   Yes [provider]  escitalopram  (LEXAPRO) 20 MG tablet Take 20 mg by mouth daily.   Yes [provider]  esomeprazole (NEXIUM) 40 MG capsule Take 40 mg by mouth daily.    Yes [provider]  fluticasone (FLONASE) 50 MCG/ACT nasal spray Place 1 spray into both nostrils daily.    Yes [provider]  ibuprofen (ADVIL,MOTRIN) 200 MG tablet Take 600-800 mg by mouth every 6 (six) hours as needed (for pain, headaches, or cramps).   Yes [provider]  simvastatin (ZOCOR) 40 MG tablet Take 40 mg by mouth at bedtime.  08/11/12  Yes [provider]    Family History No family history on file.  Social History Social History   Tobacco Use  . Smoking status: Current Some Day Smoker  . Smokeless tobacco: Never Used  Substance Use Topics  . Alcohol use: No  . Drug use: No     Allergies   Ciprofloxacin; Amoxicillin; and Latex   Review of Systems Review of Systems  Constitutional: Negative for fever.  Respiratory: Negative for cough and shortness of breath.   Cardiovascular: Positive for chest pain.  Gastrointestinal: Negative for abdominal pain, nausea and vomiting.  Genitourinary: Negative for dysuria.  All other systems reviewed and are negative.    Physical Exam Updated Vital Signs BP (!) 141/85   Pulse 74   Temp 99 F (37.2 C)   Resp  15   LMP 02/01/2018   SpO2 99%   Physical Exam  Constitutional: She is oriented to person, place, and time.  Overweight  HENT:  Head: Normocephalic and atraumatic.  Eyes: Pupils are equal, round, and reactive to light.  Cardiovascular: Normal rate, regular rhythm, normal heart sounds and normal pulses.  Pulmonary/Chest: Effort normal. No respiratory distress. She has no wheezes.  Abdominal: Soft. Bowel sounds are normal.  Musculoskeletal:       Right lower leg: Normal. She exhibits no tenderness and no edema.       Left lower leg: Normal. She exhibits no tenderness and no edema.  Neurological: She is alert and oriented to  person, place, and time.  Skin: Skin is warm and dry.  Psychiatric: She has a normal mood and affect.  Nursing note and vitals reviewed.    ED Treatments / Results  Labs (all labs ordered are listed, but only abnormal results are displayed) Labs Reviewed  BASIC METABOLIC PANEL - Abnormal; Notable for the following components:      Result Value   Glucose, Bld 114 (*)    Calcium 8.5 (*)    All other components within normal limits  I-STAT TROPONIN, ED - Abnormal; Notable for the following components:   Troponin i, poc 0.56 (*)    All other components within normal limits  CBC  I-STAT TROPONIN, ED  I-STAT BETA HCG BLOOD, ED ([facility], [facility], [facility] ONLY)    EKG EKG Interpretation  Date/Time:  Tuesday February 22 2018 22:46:13 EDT Ventricular Rate:  75 PR Interval:    QRS Duration: 93 QT Interval:  370 QTC Calculation: 414 R Axis:   52 Text Interpretation:  Sinus rhythm Low voltage, precordial leads Borderline T wave abnormalities No prior for comparison Confirmed by Ross Marcus ([id number]) on 02/22/2018 11:04:19 PM   Radiology Dg Chest 2 View  Result Date: 02/22/2018 CLINICAL DATA:  Chest pain, left jaw pain, and left arm pain tonight. EXAM: CHEST - 2 VIEW COMPARISON:  None. FINDINGS: Normal heart size and pulmonary vascularity. No focal airspace disease or consolidation in the lungs. No blunting of costophrenic angles. No pneumothorax. Mediastinal contours appear intact. IMPRESSION: No active disease. Electronically Signed   By: Burman Nieves M.D.   On: 02/22/2018 23:30    Procedures Procedures (including critical care time)  CRITICAL CARE Performed by: Shon Baton   Total critical care time: 30 minutes  Critical care time was exclusive of separately billable procedures and treating other patients.  Critical care was necessary to treat or prevent imminent or life-threatening deterioration.  Critical care was time spent personally by me on the following activities:  development of treatment plan with patient and/or surrogate as well as nursing, discussions with consultants, evaluation of patient's response to treatment, examination of patient, obtaining history from patient or surrogate, ordering and performing treatments and interventions, ordering and review of laboratory studies, ordering and review of radiographic studies, pulse oximetry and re-evaluation of patient's condition.   Medications Ordered in ED Medications - No data to display   Initial Impression / Assessment and Plan / ED Course  I have reviewed the triage vital signs and the nursing notes.  Pertinent labs & imaging results that were available during my care of the patient were reviewed by me and considered in my medical decision making (see chart for details).     Resents with chest pain.  She has had several recurrent episodes including one on Sunday and 2 today.  She has  several risk factors including family history, obesity, smoking, hyperlipidemia.  She is currently chest pain-free.  She received aspirin in route.  Some of her features are atypical; however, the radiation is concerning as well as the exertional components.  EKG shows no evidence of acute ischemia.  There are some nonspecific T wave changes.  No prior for comparison.  X-ray shows no evidence of pneumothorax or pneumonia.  Initial lab work with a troponin of 0.08.  This is the upper limit of normal range.  Her heart score is 4.  Given her heart score, would recommend admission for serial troponins and chest pain rule out.  Patient is agreeable to plan.  She has remained chest pain-free in the emergency room.  2:52 AM Patient now does not wish to be admitted.  We discussed the risk and benefits of going home with chest pain given her risk factors.  Patient stated understanding.  She has capacity to make decisions for herself.  I have convinced her to stay for 1 additional test and a repeat troponin at 2:30 AM.  2:52  AM Repeat troponin is positive at 0.59.  Patient was informed.  She continues to be chest pain-free.  Hospitalist was updated and patient is now amenable to admission.  Will discuss with cardiology as well all the hospitalist has agreed to admit.    Final Clinical Impressions(s) / ED Diagnoses   Final diagnoses:  Precordial pain  NSTEMI (non-ST elevated myocardial infarction) [facility])    ED Discharge Orders    None       Horton, Mayer Masker, MD 02/23/18 1610    Shon Baton, MD 02/23/18 [phone]

## 2018-02-23 NOTE — ED Notes (Signed)
[facility] and he will put orders in on patient shortly; original orders were d/c'd when patient originally wanted to refuse admission.

## 2018-02-23 NOTE — H&P (View-Only) (Signed)
Cardiology Consult    Patient ID: Adrienne [name] MRN: [id number], DOB/AGE: 31-Aug-1970   Admit date: 02/22/2018 Date of Consult: 02/23/2018  Primary Physician: Eartha Inch, MD Primary Cardiologist: New Requesting Provider: Dr. Jerolyn Center  Reason for Consultation: Chest pain with + trop   Adrienne [name] is a 47 y.o. female who is being seen today for the evaluation of chest pain and + trop at the request of Dr. Jerolyn Center.   Patient Profile    47 yo female with PMH of HL, GERD and tobacco use who presented with chest pain and found to have + trop.   Past Medical History   Past Medical History:  Diagnosis Date  . GERD (gastroesophageal reflux disease)   . Hyperlipidemia     Past Surgical History:  Procedure Laterality Date  . CESAREAN SECTION    . finger surgery       Allergies  Allergies  Allergen Reactions  . Ciprofloxacin Swelling    Tongue and lips swell  . Amoxicillin Hives  . Latex Itching and Rash    Latex allergy is to "condoms," but gloves are tolerated    History of Present Illness    Adrienne [name] is a 47 yo female with PMH of HL, GERD and tobacco use. Reports she is followed by her PCP for her cholesterol and last readings have been better controlled. Has been smoking for the last 25-30 years about 1ppd. Does have family hx of CAD with mother and father having MIs in their early 1s.   Reports she has been in her usual state of health until yesterday afternoon around 1pm. She was sitting getting her nails done when she developed centralized chest discomfort with radiation into her jaw. Symptoms lingered and she took a Xanax thinking this was related to her anxiety. Has GERD but reports this definitely felt different that her usual GERD symptoms. Ran some errands afterwards, in and out of several stores. Around 6pm symptoms progressed and she developed left arm pain with significant pain in her left wrist. Family became concerned and called EMS. She took  324mg  ASA and actually began to feel better by the time EMS arrived. Offered nitro but stated she did not want to get a headache. By the time she arrived to the ED her symptoms had resolved.   Labs showed stable electrolytes, Hgb 13.8, POC trop 0.00>>0.56, with delta troponin 0.70. EKG showed SR with no acute ST/T wave abnormalities. She was actually wanting to leave, until her second troponin came back elevated.   Inpatient Medications    . aspirin EC  325 mg Oral Daily  . enoxaparin (LOVENOX) injection  40 mg Subcutaneous Q24H  . escitalopram  20 mg Oral Daily  . pantoprazole  40 mg Oral Daily  . simvastatin  40 mg Oral QHS    Family History    Family History  Problem Relation Age of Onset  . CAD Mother     Social History    Social History   Socioeconomic History  . Marital status: Married    Spouse name: Not on file  . Number of children: Not on file  . Years of education: Not on file  . Highest education level: Not on file  Occupational History  . Not on file  Social Needs  . Financial resource strain: Not on file  . Food insecurity:    Worry: Not on file    Inability: Not on file  . Transportation needs:  Medical: Not on file    Non-medical: Not on file  Tobacco Use  . Smoking status: Current Some Day Smoker  . Smokeless tobacco: Never Used  Substance and Sexual Activity  . Alcohol use: No  . Drug use: No  . Sexual activity: Not on file  Lifestyle  . Physical activity:    Days per week: Not on file    Minutes per session: Not on file  . Stress: Not on file  Relationships  . Social connections:    Talks on phone: Not on file    Gets together: Not on file    Attends religious service: Not on file    Active member of club or organization: Not on file    Attends meetings of clubs or organizations: Not on file    Relationship status: Not on file  . Intimate partner violence:    Fear of current or ex partner: Not on file    Emotionally abused: Not on  file    Physically abused: Not on file    Forced sexual activity: Not on file  Other Topics Concern  . Not on file  Social History Narrative  . Not on file     Review of Systems    See HPI  All other systems reviewed and are otherwise negative except as noted above.  Physical Exam    Blood pressure (!) 156/85, pulse 72, temperature 98 F (36.7 C), temperature source Oral, resp. rate 18, height 5\' 4"  (1.626 m), weight 239 lb 6.4 oz (108.6 kg), last menstrual period 02/01/2018, SpO2 95 %.  General: Pleasant, obese WF, NAD Psych: Normal affect. Neuro: Alert and oriented X 3. Moves all extremities spontaneously. HEENT: Normal  Neck: Supple without bruits or JVD. Lungs:  Resp regular and unlabored, CTA. Heart: RRR no s3, s4, or murmurs. Abdomen: Soft, non-tender, non-distended, BS + x 4.  Extremities: No clubbing, cyanosis or edema. DP/PT/Radials 2+ and equal bilaterally.  Labs    Troponin [facility](Point of Care Test) Recent Labs    02/23/18 0214  TROPIPOC 0.56*   Recent Labs    02/23/18 0516  TROPONINI 0.70*   Lab Results  Component Value Date   WBC 9.6 02/23/2018   HGB 13.2 02/23/2018   HCT 40.5 02/23/2018   MCV 95.7 02/23/2018   PLT 220 02/23/2018    Recent Labs  Lab 02/22/18 2305 02/23/18 0516  NA 140  --   K 3.7  --   CL 108  --   CO2 25  --   BUN 7  --   CREATININE 0.65 0.62  CALCIUM 8.5*  --   GLUCOSE 114*  --    No results found for: CHOL, HDL, LDLCALC, TRIG No results found for: [facility]   Radiology Studies    Dg Chest 2 View  Result Date: 02/22/2018 CLINICAL DATA:  Chest pain, left jaw pain, and left arm pain tonight. EXAM: CHEST - 2 VIEW COMPARISON:  None. FINDINGS: Normal heart size and pulmonary vascularity. No focal airspace disease or consolidation in the lungs. No blunting of costophrenic angles. No pneumothorax. Mediastinal contours appear intact. IMPRESSION: No active disease. Electronically Signed   By: Burman NievesWilliam  Stevens M.D.   On: 02/22/2018  23:30    ECG & Cardiac Imaging    EKG:  The EKG was personally reviewed and demonstrates SR  Echo: pending  Assessment & Plan    47 yo female with PMH of HL, GERD and tobacco use who presented with chest pain and  found to have + trop.   1. NSTEMI: Symptoms yesterday are concerning for ACS. Has CRFs with long standing tobacco use, HL and family hx of CAD. Trop rising with last reading at 0.70. EKG shows SR with no acute ST/T wave changes. Will plan for cardiac cath today.  -- The patient understands that risks included but are not limited to stroke (1 in 1000), death (1 in 1000), kidney failure [usually temporary] (1 in 500), bleeding (1 in 200), allergic reaction [possibly serious] (1 in 200).   2. HL: followed by PCP for the same. Has been on Zocor, states her last readings have been better controlled. In review of care everywhere last LDL 4/19 was 93.   3. GERD: on PPI  4. Tobacco use: cessation discussed.   Janice Coffin, NP-C Pager [phone] 02/23/2018, 11:06 AM

## 2018-02-23 NOTE — ED Notes (Signed)
Due to troponin being elevated; patient has decided to stay.

## 2018-02-23 NOTE — Progress Notes (Signed)
  Echocardiogram 2D Echocardiogram has been performed.  Adrienne SavoyCasey N [name] 02/23/2018, 12:30 PM

## 2018-02-23 NOTE — ED Notes (Signed)
Pt requesting to speak to provider regarding admission. RN attempted education regarding chest pain. EDP and admitting, MD notified.

## 2018-02-23 NOTE — H&P (Signed)
History and Physical    Adrienne [name] YNW:[id number] DOB: July 27, 1971 DOA: 02/22/2018  PCP: Eartha Inch, MD  Patient coming from: Home.  Chief Complaint: Chest pain.  HPI: Adrienne [name] is a 47 y.o. female with history of hyperlipidemia GERD tobacco abuse presents with chest pain.  Patient has been having chest pain off and on for last 2 days.  Pain is retrosternal radiating to the neck.  Happened first time 2 days ago and lasted for 2 hours and resolved without any intervention.  Following day patient was chest pain-free.  That yesterday morning patient started having chest pain at the workplace.  Was coming off and on.  Started became more permanent after she left job and was doing errands.  Denies any associated shortness of breath nausea vomiting or abdominal pain.  ED Course: In the ER patient was chest pain-free.  EKG was showing normal sinus rhythm with nonspecific ST-T changes.  Initial troponin was negative subsequent one was positive.  Patient admitted for further work-up of chest pain.  Review of Systems: As per HPI, rest all negative.   Past Medical History:  Diagnosis Date  . GERD (gastroesophageal reflux disease)   . Hyperlipidemia     Past Surgical History:  Procedure Laterality Date  . CESAREAN SECTION    . finger surgery       reports that she has been smoking.  She has never used smokeless tobacco. She reports that she does not drink alcohol or use drugs.  Allergies  Allergen Reactions  . Ciprofloxacin Swelling    Tongue and lips swell  . Amoxicillin Hives  . Latex Itching and Rash    Latex allergy is to "condoms," but gloves are tolerated    Family History  Problem Relation Age of Onset  . CAD Mother     Prior to Admission medications   Medication Sig Start Date End Date Taking? Authorizing Provider  albuterol (PROAIR HFA) 108 (90 Base) MCG/ACT inhaler Inhale 1-2 puffs into the lungs every 6 (six) hours as needed for wheezing or shortness  of breath.   Yes [provider]  ALPRAZolam Prudy Feeler) 1 MG tablet Take 0.5-1 mg by mouth 3 (three) times daily as needed for anxiety.  02/22/18  Yes [provider]  aspirin 81 MG tablet Take 81 mg by mouth daily.   Yes [provider]  escitalopram (LEXAPRO) 20 MG tablet Take 20 mg by mouth daily.   Yes [provider]  esomeprazole (NEXIUM) 40 MG capsule Take 40 mg by mouth daily.    Yes [provider]  fluticasone (FLONASE) 50 MCG/ACT nasal spray Place 1 spray into both nostrils daily.    Yes [provider]  ibuprofen (ADVIL,MOTRIN) 200 MG tablet Take 600-800 mg by mouth every 6 (six) hours as needed (for pain, headaches, or cramps).   Yes [provider]  simvastatin (ZOCOR) 40 MG tablet Take 40 mg by mouth at bedtime.  08/11/12  Yes [provider]    Physical Exam: Vitals:   02/23/18 0100 02/23/18 0204 02/23/18 0319 02/23/18 0324  BP:  (!) 141/85  (!) 150/86  Pulse: 72 74  74  Resp: 15   18  Temp:    98 F (36.7 C)  TempSrc:    Oral  SpO2: 99% 99%  97%  Weight:   108.6 kg (239 lb 6.4 oz)   Height:   5\' 4"  (1.626 m)       Constitutional: Moderately built and  nourished. Vitals:   02/23/18 0100 02/23/18 0204 02/23/18 0319 02/23/18 0324  BP:  (!) 141/85  (!) 150/86  Pulse: 72 74  74  Resp: 15   18  Temp:    98 F (36.7 C)  TempSrc:    Oral  SpO2: 99% 99%  97%  Weight:   108.6 kg (239 lb 6.4 oz)   Height:   5\' 4"  (1.626 m)    Eyes: Anicteric no pallor. ENMT: No discharge from the ears eyes nose or mouth. Neck: No mass felt.  No neck rigidity.  No JVD appreciated. Respiratory: No rhonchi or crepitations. Cardiovascular: S1-S2 heard no murmurs appreciated. Abdomen: Soft nontender bowel sounds present. Musculoskeletal: No edema.  No joint effusion. Skin: No rash. Neurologic: Alert awake oriented to time place and person.  Moves all extremities 5 x 5. Psychiatric: Appears normal per normal  affect.   Labs on Admission: I have personally reviewed following labs and imaging studies  CBC: Recent Labs  Lab 02/22/18 2305  WBC 9.6  HGB 13.8  HCT 42.8  MCV 96.8  PLT 211   Basic Metabolic Panel: Recent Labs  Lab 02/22/18 2305  NA 140  K 3.7  CL 108  CO2 25  GLUCOSE 114*  BUN 7  CREATININE 0.65  CALCIUM 8.5*   GFR: Estimated Creatinine Clearance: 104.7 mL/min (by C-G formula based on SCr of 0.65 mg/dL). Liver Function Tests: No results for input(s): AST, ALT, ALKPHOS, BILITOT, PROT, ALBUMIN in the last 168 hours. No results for input(s): LIPASE, AMYLASE in the last 168 hours. No results for input(s): AMMONIA in the last 168 hours. Coagulation Profile: No results for input(s): INR, PROTIME in the last 168 hours. Cardiac Enzymes: No results for input(s): CKTOTAL, CKMB, CKMBINDEX, TROPONINI in the last 168 hours. BNP (last 3 results) No results for input(s): PROBNP in the last 8760 hours. HbA1C: No results for input(s): HGBA1C in the last 72 hours. CBG: No results for input(s): GLUCAP in the last 168 hours. Lipid Profile: No results for input(s): CHOL, HDL, LDLCALC, TRIG, CHOLHDL, LDLDIRECT in the last 72 hours. Thyroid Function Tests: No results for input(s): TSH, T4TOTAL, FREET4, T3FREE, THYROIDAB in the last 72 hours. Anemia Panel: No results for input(s): VITAMINB12, FOLATE, FERRITIN, TIBC, IRON, RETICCTPCT in the last 72 hours. Urine analysis: No results found for: COLORURINE, APPEARANCEUR, LABSPEC, PHURINE, GLUCOSEU, HGBUR, BILIRUBINUR, KETONESUR, PROTEINUR, UROBILINOGEN, NITRITE, LEUKOCYTESUR Sepsis Labs: @LABRCNTIP (procalcitonin:4,lacticidven:4) )No results found for this or any previous visit (from the past 240 hour(s)).   Radiological Exams on Admission: Dg Chest 2 View  Result Date: 02/22/2018 CLINICAL DATA:  Chest pain, left jaw pain, and left arm pain tonight. EXAM: CHEST - 2 VIEW COMPARISON:  None. FINDINGS: Normal heart size and pulmonary  vascularity. No focal airspace disease or consolidation in the lungs. No blunting of costophrenic angles. No pneumothorax. Mediastinal contours appear intact. IMPRESSION: No active disease. Electronically Signed   By: Burman Nieves M.D.   On: 02/22/2018 23:30    EKG: Independently reviewed.  Normal sinus rhythm with nonspecific ST-T changes.  Assessment/Plan Principal Problem:   Chest pain Active Problems:   HLD (hyperlipidemia)   GERD (gastroesophageal reflux disease)    1. Chest pain with positive troponin -we will cycle cardiac markers check 2D echo aspirin PRN nitroglycerin continue statins.  Will consult cardiology.  Keep patient n.p.o. except medication in anticipation of procedure.  Risk factors include tobacco abuse hyperlipidemia and family history. 2. Hyperlipidemia on statins. 3. History of GERD on PPI.  4. Tobacco abuse advised to quit smoking.   DVT prophylaxis: Lovenox. Code Status: Full code. Family Communication: Patient's family at the bedside. Disposition Plan: Home. Consults called: Cardiology. Admission status: Observation.   Eduard ClosArshad N Zariel Capano MD [facility] Pager [phone].  If 7PM-7AM, please contact night-coverage [email or web address] Password [email or web address]  02/23/2018, 4:57 AM

## 2018-02-23 NOTE — Plan of Care (Signed)
47 year old female earlier this morning with chest pain concerning for acute coronary syndrome.  She has increasing troponins.  Patient seen by cardiology and is scheduled for cardiac cath later today.

## 2018-02-24 DIAGNOSIS — K219 Gastro-esophageal reflux disease without esophagitis: Secondary | ICD-10-CM

## 2018-02-24 DIAGNOSIS — E785 Hyperlipidemia, unspecified: Secondary | ICD-10-CM

## 2018-02-24 DIAGNOSIS — Z6841 Body Mass Index (BMI) 40.0 and over, adult: Secondary | ICD-10-CM | POA: Diagnosis not present

## 2018-02-24 DIAGNOSIS — Z8249 Family history of ischemic heart disease and other diseases of the circulatory system: Secondary | ICD-10-CM | POA: Diagnosis not present

## 2018-02-24 DIAGNOSIS — I214 Non-ST elevation (NSTEMI) myocardial infarction: Secondary | ICD-10-CM | POA: Diagnosis present

## 2018-02-24 DIAGNOSIS — F17200 Nicotine dependence, unspecified, uncomplicated: Secondary | ICD-10-CM | POA: Diagnosis present

## 2018-02-24 DIAGNOSIS — Z7982 Long term (current) use of aspirin: Secondary | ICD-10-CM | POA: Diagnosis not present

## 2018-02-24 DIAGNOSIS — I249 Acute ischemic heart disease, unspecified: Secondary | ICD-10-CM | POA: Diagnosis not present

## 2018-02-24 DIAGNOSIS — Z7951 Long term (current) use of inhaled steroids: Secondary | ICD-10-CM | POA: Diagnosis not present

## 2018-02-24 DIAGNOSIS — R079 Chest pain, unspecified: Secondary | ICD-10-CM | POA: Diagnosis not present

## 2018-02-24 DIAGNOSIS — R072 Precordial pain: Secondary | ICD-10-CM | POA: Diagnosis present

## 2018-02-24 DIAGNOSIS — Z79899 Other long term (current) drug therapy: Secondary | ICD-10-CM | POA: Diagnosis not present

## 2018-02-24 DIAGNOSIS — E876 Hypokalemia: Secondary | ICD-10-CM

## 2018-02-24 DIAGNOSIS — I2510 Atherosclerotic heart disease of native coronary artery without angina pectoris: Secondary | ICD-10-CM | POA: Diagnosis present

## 2018-02-24 DIAGNOSIS — Z881 Allergy status to other antibiotic agents status: Secondary | ICD-10-CM | POA: Diagnosis not present

## 2018-02-24 DIAGNOSIS — Z880 Allergy status to penicillin: Secondary | ICD-10-CM | POA: Diagnosis not present

## 2018-02-24 DIAGNOSIS — Z91040 Latex allergy status: Secondary | ICD-10-CM | POA: Diagnosis not present

## 2018-02-24 LAB — HEPARIN LEVEL (UNFRACTIONATED)
Heparin Unfractionated: 0.12 IU/mL — ABNORMAL LOW (ref 0.30–0.70)
Heparin Unfractionated: 0.31 IU/mL (ref 0.30–0.70)
Heparin Unfractionated: 0.4 IU/mL (ref 0.30–0.70)

## 2018-02-24 LAB — BASIC METABOLIC PANEL
Anion gap: 6 (ref 5–15)
BUN: 7 mg/dL (ref 6–20)
CHLORIDE: 109 mmol/L (ref 98–111)
CO2: 21 mmol/L — AB (ref 22–32)
Calcium: 8.3 mg/dL — ABNORMAL LOW (ref 8.9–10.3)
Creatinine, Ser: 0.66 mg/dL (ref 0.44–1.00)
GFR calc non Af Amer: >60 mL/min (ref >60)
Glucose, Bld: 107 mg/dL — ABNORMAL HIGH (ref 70–99)
POTASSIUM: 3.2 mmol/L — AB (ref 3.5–5.1)
Sodium: 136 mmol/L (ref 135–145)

## 2018-02-24 LAB — CBC
HEMATOCRIT: 41 % (ref 36.0–46.0)
HEMOGLOBIN: 13.3 g/dL (ref 12.0–15.0)
MCH: 31.3 pg (ref 26.0–34.0)
MCHC: 32.4 g/dL (ref 30.0–36.0)
MCV: 96.5 fL (ref 78.0–100.0)
Platelets: 210 10*3/uL (ref 150–400)
RBC: 4.25 MIL/uL (ref 3.87–5.11)
RDW: 14.1 % (ref 11.5–15.5)
WBC: 9.4 10*3/uL (ref 4.0–10.5)

## 2018-02-24 MED ORDER — SODIUM CHLORIDE 0.9 % IV SOLN
INTRAVENOUS | Status: DC
  Administered 2018-02-25: via INTRAVENOUS

## 2018-02-24 MED ORDER — POTASSIUM CHLORIDE 20 MEQ/15ML (10%) PO SOLN
60.0000 meq | Freq: Once | ORAL | Status: DC
  Filled 2018-02-24: qty 45

## 2018-02-24 MED ORDER — POTASSIUM CHLORIDE CRYS ER 20 MEQ PO TBCR
40.0000 meq | EXTENDED_RELEASE_TABLET | ORAL | Status: AC
  Administered 2018-02-24 (×2): 40 meq via ORAL
  Filled 2018-02-24 (×2): qty 2

## 2018-02-24 MED ORDER — ASPIRIN 81 MG PO CHEW
81.0000 mg | CHEWABLE_TABLET | ORAL | Status: AC
  Administered 2018-02-25: 81 mg via ORAL

## 2018-02-24 MED ORDER — SODIUM CHLORIDE 0.9% FLUSH: 3.0000 mL | INTRAVENOUS | Status: DC | PRN

## 2018-02-24 MED ORDER — SODIUM CHLORIDE 0.9% FLUSH: 3.0000 mL | Freq: Two times a day (BID) | INTRAVENOUS | Status: DC

## 2018-02-24 MED ORDER — SODIUM CHLORIDE 0.9 % IV SOLN: 250.0000 mL | INTRAVENOUS | Status: DC | PRN

## 2018-02-24 MED ORDER — ESCITALOPRAM OXALATE 10 MG PO TABS
10.0000 mg | ORAL_TABLET | Freq: Every day | ORAL | Status: DC
  Administered 2018-02-24 – 2018-02-25 (×2): 10 mg via ORAL
  Filled 2018-02-24 (×2): qty 1

## 2018-02-24 NOTE — Progress Notes (Signed)
Made Dr. Ella JubileeArrien aware of pt home dose of lexapro 10mg  and order here for 20mg . He stated he will change dose.

## 2018-02-24 NOTE — Progress Notes (Signed)
 Progress Note  Patient Name: Adrienne [name] Date of Encounter: 02/24/2018  Primary Cardiologist: Henry Smith  Subjective   No chest pain this am. No dyspnea. She is anxious  Inpatient Medications    Scheduled Meds: . aspirin EC  81 mg Oral Daily  . escitalopram  20 mg Oral Daily  . metoprolol tartrate  25 mg Oral BID  . pantoprazole  40 mg Oral Daily  . simvastatin  40 mg Oral QHS  . sodium chloride flush  3 mL Intravenous Q12H  . ticagrelor  90 mg Oral BID   Continuous Infusions: . sodium chloride    . heparin 1,250 Units/hr (02/24/18 0636)   PRN Meds: sodium chloride, acetaminophen, ALPRAZolam, ondansetron (ZOFRAN) IV, sodium chloride flush   Vital Signs    Vitals:   02/23/18 1930 02/23/18 2007 02/23/18 2353 02/24/18 0443  BP: (!) 142/86 135/74 137/65 (!) 142/83  Pulse: 74 73 66 62  Resp:   16 18  Temp:   98.2 F (36.8 C) 98.2 F (36.8 C)  TempSrc:   Oral Oral  SpO2:   97% 98%  Weight:    235 lb 3.2 oz (106.7 kg)  Height:        Intake/Output Summary (Last 24 hours) at 02/24/2018 0742 Last data filed at 02/24/2018 0636 Gross per 24 hour  Intake 1263.41 ml  Output -  Net 1263.41 ml   Filed Weights   02/23/18 0319 02/24/18 0443  Weight: 239 lb 6.4 oz (108.6 kg) 235 lb 3.2 oz (106.7 kg)    Telemetry    Sinus - Personally Reviewed  ECG    No AM EKG - Personally Reviewed  Physical Exam    General: Well developed, well nourished, NAD  HEENT: OP clear, mucus membranes moist  SKIN: warm, dry. No rashes. Neuro: No focal deficits  Musculoskeletal: Muscle strength 5/5 all ext  Psychiatric: Mood and affect normal  Neck: No JVD, no carotid bruits, no thyromegaly, no lymphadenopathy.  Lungs:Clear bilaterally, no wheezes, rhonci, crackles Cardiovascular: Regular rate and rhythm. No murmurs, gallops or rubs. Abdomen:Soft. Bowel sounds present. Non-tender.  Extremities: No lower extremity edema. Pulses are 2 + in the bilateral DP/PT. Right radial cath  site without hematoma.    Labs    Chemistry Recent Labs  Lab 02/22/18 2305 02/23/18 0516 02/24/18 0534  NA 140  --  136  K 3.7  --  3.2*  CL 108  --  109  CO2 25  --  21*  GLUCOSE 114*  --  107*  BUN 7  --  7  CREATININE 0.65 0.62 0.66  CALCIUM 8.5*  --  8.3*  GFRNONAA >60 >60 >60  GFRAA >60 >60 >60  ANIONGAP 7  --  6     Hematology Recent Labs  Lab 02/22/18 2305 02/23/18 0516 02/24/18 0534  WBC 9.6 9.6 9.4  RBC 4.42 4.23 4.25  HGB 13.8 13.2 13.3  HCT 42.8 40.5 41.0  MCV 96.8 95.7 96.5  MCH 31.2 31.2 31.3  MCHC 32.2 32.6 32.4  RDW 14.2 14.0 14.1  PLT 211 220 210    Cardiac Enzymes Recent Labs  Lab 02/23/18 0516 02/23/18 1030 02/23/18 1844  TROPONINI 0.70* 1.23* 0.70*    Recent Labs  Lab 02/22/18 2313 02/23/18 0214  TROPIPOC 0.08 0.56*     BNPNo results for input(s): BNP, PROBNP in the last 168 hours.   DDimer No results for input(s): DDIMER in the last 168 hours.   Radiology      Dg Chest 2 View  Result Date: 02/22/2018 CLINICAL DATA:  Chest pain, left jaw pain, and left arm pain tonight. EXAM: CHEST - 2 VIEW COMPARISON:  None. FINDINGS: Normal heart size and pulmonary vascularity. No focal airspace disease or consolidation in the lungs. No blunting of costophrenic angles. No pneumothorax. Mediastinal contours appear intact. IMPRESSION: No active disease. Electronically Signed   By: William  Stevens M.D.   On: 02/22/2018 23:30    Cardiac Studies   Echo 02/23/18: - Left ventricle: The cavity size was normal. There was mild   concentric hypertrophy. Systolic function was vigorous. The   estimated ejection fraction was in the range of 65% to 70%. Wall   motion was normal; there were no regional wall motion   abnormalities. Left ventricular diastolic function parameters   were normal. - Aortic valve: There was no regurgitation. - Aortic root: The aortic root was normal in size. - Right ventricle: The cavity size was normal. Wall thickness was    normal. Systolic function was normal. - Right atrium: The atrium was normal in size. - Tricuspid valve: There was trivial regurgitation. - Pulmonic valve: There was no regurgitation. - Pulmonary arteries: Systolic pressure was within the normal   range. - Inferior vena cava: The vessel was normal in size. - Pericardium, extracardiac: There was no pericardial effusion.  Impressions:  - Normal study. There is no significant change since the prior   study on 09/16/2012.  Cardiac cath 02/23/18:  Ost RCA lesion is 90% stenosed.  Mid Cx lesion is 50% stenosed.  Ost LAD to Prox LAD lesion is 50% stenosed.   1. Severe stenosis in the ostium of the large, dominant RCA 2. Moderate non-obstructive disease in the proximal and mid LAD and in the mid Circumflex artery 3. Normal filling pressures  Patient Profile     47 y.o. female with history of obesity, tobacco abuse and HLD admitted with a NSTEMI and found to have severe ostial RCA stenosis.   Assessment & Plan    1. CAD/NSTEMI: Cardiac cath 02/23/18 with severe ostial RCA stenosis. Cath from radial artery and unable to engage the RCA well. Plans to return to cath lab Friday 02/25/18 for staged PCI of the RCA from the groin approach. Will continue ASA, Brilinta, statin and beta blocker. I have reviewed the risks, indications, and alternatives to cardiac catheterization, possible angioplasty, and stenting with the patient. Risks include but are not limited to bleeding, infection, vascular injury, stroke, myocardial infection, arrhythmia, kidney injury, radiation-related injury in the case of prolonged fluoroscopy use, emergency cardiac surgery, and death. The patient understands the risks of serious complication is 1-2% or less with angioplasty/stenting.  2. Hypokalemia: Will replace potassium this am.   For questions or updates, please contact [facility] Please consult [email or web address] for contact info under Cardiology/STEMI.       Signed, Major Santerre, MD  02/24/2018, 7:42 AM    

## 2018-02-24 NOTE — Progress Notes (Signed)
Patient refused second IV to be inserted. Wants IV inserted in cath lab. No other complaints at this time. Will continue to monitor patient.

## 2018-02-24 NOTE — Progress Notes (Signed)
PROGRESS NOTE    Adrienne [name]  VWU:[id number] DOB: [birth year] DOA: 02/22/2018 PCP: Eartha Inch, MD    Brief Narrative:  47 year old female who presented with chest pain.  She does have significant past medical history for dyslipidemia, GERD, and tobacco abuse.  Reported intermittent chest pain for last 48 hours, retrosternal, radiated to the neck.  Lasting for about 2 hours with no associated symptoms.  On the initial physical examination blood pressure 141/85, heart rate 74, respirate 18, temperature 98, oxygen saturation 97%.  Moist mucous membranes, lungs clear to auscultation bilaterally, heart S1-S2 present rhythmic, abdomen soft nontender, no lower extremity edema.  Sodium 140, potassium 3.7, chloride 108, bicarb 25, glucose 114, BUN 7, creatinine 0.65, troponin 0.7, 9.6, hemoglobin 13.8, hematocrit 42.8, platelets 211.  Chest x-ray with no infiltrates.  EKG sinus rhythm, low voltage, no ST elevations, no ST depressions, no significant T wave abnormalities.  Normal axis.  Patient was admitted to the hospital with a working diagnosis of atypical chest pain to rule out acute coronary syndrome.  Assessment & Plan:   Principal Problem:   Chest pain Active Problems:   HLD (hyperlipidemia)   GERD (gastroesophageal reflux disease)   ACS (acute coronary syndrome) (HCC)   NSTEMI (non-ST elevated myocardial infarction) (HCC)   1. NSTEMI. Scheduled PCI in am plan to intervene on RCA. Patient is chest pain free, will continue heparin drip for now. Continue aspirin, ticagrelor, metoprolol and simvastatin.   2. Hypokalemia. Will continue correction with kcl, follow on electrolytes in am, renal function is preserved.  3. Dyslipidemia. Continue statin therapy.  4. Tobacco abuse. Smoking cessation.    DVT prophylaxis: heparin   Code Status: full Family Communication: no family at the bedside  Disposition Plan: home after pci   Consultants:   Cardiology   Procedures:      Antimicrobials:       Subjective: Patient is feeling well, no chest pain or dyspnea, no nausea or vomiting.   Objective: Vitals:   02/23/18 2353 02/24/18 0443 02/24/18 0904 02/24/18 0908  BP: 137/65 (!) 142/83 139/65 139/65  Pulse: 66 62  64  Resp: 16 18    Temp: 98.2 F (36.8 C) 98.2 F (36.8 C)    TempSrc: Oral Oral    SpO2: 97% 98%  95%  Weight:  106.7 kg (235 lb 3.2 oz)    Height:        Intake/Output Summary (Last 24 hours) at 02/24/2018 1405 Last data filed at 02/24/2018 0636 Gross per 24 hour  Intake 1263.41 ml  Output -  Net 1263.41 ml   Filed Weights   02/23/18 0319 02/24/18 0443  Weight: 108.6 kg (239 lb 6.4 oz) 106.7 kg (235 lb 3.2 oz)    Examination:   General: Not in pain or dyspnea Neurology: Awake and alert, non focal  E ENT: no pallor, no icterus, oral mucosa moist Cardiovascular: No JVD. S1-S2 present, rhythmic, no gallops, rubs, or murmurs. No lower extremity edema. Pulmonary: vesicular breath sounds bilaterally, adequate air movement, no wheezing, rhonchi or rales. Gastrointestinal. Abdomen with no organomegaly, non tender, no rebound or guarding Skin. No rashes Musculoskeletal: no joint deformities     Data Reviewed: I have personally reviewed following labs and imaging studies  CBC: Recent Labs  Lab 02/22/18 2305 02/23/18 0516 02/24/18 0534  WBC 9.6 9.6 9.4  HGB 13.8 13.2 13.3  HCT 42.8 40.5 41.0  MCV 96.8 95.7 96.5  PLT 211 220 210   Basic Metabolic Panel: Recent  Labs  Lab 02/22/18 2305 02/23/18 0516 02/24/18 0534  NA 140  --  136  K 3.7  --  3.2*  CL 108  --  109  CO2 25  --  21*  GLUCOSE 114*  --  107*  BUN 7  --  7  CREATININE 0.65 0.62 0.66  CALCIUM 8.5*  --  8.3*   GFR: Estimated Creatinine Clearance: 103.6 mL/min (by C-G formula based on SCr of 0.66 mg/dL). Liver Function Tests: No results for input(s): AST, ALT, ALKPHOS, BILITOT, PROT, ALBUMIN in the last 168 hours. No results for input(s): LIPASE,  AMYLASE in the last 168 hours. No results for input(s): AMMONIA in the last 168 hours. Coagulation Profile: Recent Labs  Lab 02/23/18 1242  INR 1.00   Cardiac Enzymes: Recent Labs  Lab 02/23/18 0516 02/23/18 1030 02/23/18 1844  TROPONINI 0.70* 1.23* 0.70*   BNP (last 3 results) No results for input(s): PROBNP in the last 8760 hours. HbA1C: No results for input(s): HGBA1C in the last 72 hours. CBG: No results for input(s): GLUCAP in the last 168 hours. Lipid Profile: No results for input(s): CHOL, HDL, LDLCALC, TRIG, CHOLHDL, LDLDIRECT in the last 72 hours. Thyroid Function Tests: No results for input(s): TSH, T4TOTAL, FREET4, T3FREE, THYROIDAB in the last 72 hours. Anemia Panel: No results for input(s): VITAMINB12, FOLATE, FERRITIN, TIBC, IRON, RETICCTPCT in the last 72 hours.    Radiology Studies: I have reviewed all of the imaging during this hospital visit personally     Scheduled Meds: . [START ON 02/25/2018] aspirin  81 mg Oral Pre-Cath  . aspirin EC  81 mg Oral Daily  . escitalopram  10 mg Oral Daily  . metoprolol tartrate  25 mg Oral BID  . pantoprazole  40 mg Oral Daily  . potassium chloride  40 mEq Oral Q4H  . simvastatin  40 mg Oral QHS  . sodium chloride flush  3 mL Intravenous Q12H  . sodium chloride flush  3 mL Intravenous Q12H  . ticagrelor  90 mg Oral BID   Continuous Infusions: . sodium chloride    . sodium chloride    . [START ON 02/25/2018] sodium chloride    . heparin 1,250 Units/hr (02/24/18 0636)     LOS: 0 days        Khyleigh Furney Annett Gulaaniel Samer Dutton, MD [facility] Pager [phone]

## 2018-02-24 NOTE — Progress Notes (Signed)
ANTICOAGULATION CONSULT NOTE  Pharmacy Consult for heparin Indication: chest pain/ACS  Heparin Dosing Weight: 80.4 kg  Labs: Recent Labs    02/22/18 2305 02/23/18 0516 02/23/18 1030 02/23/18 1242 02/23/18 1844 02/24/18 0534 02/24/18 1137  HGB 13.8 13.2  --   --   --  13.3  --   HCT 42.8 40.5  --   --   --  41.0  --   PLT 211 220  --   --   --  210  --   LABPROT  --   --   --  13.1  --   --   --   INR  --   --   --  1.00  --   --   --   HEPARINUNFRC  --   --   --   --   --  0.12* 0.31  CREATININE 0.65 0.62  --   --   --  0.66  --   TROPONINI  --  0.70* 1.23*  --  0.70*  --   --     Assessment: [age] yof presenting with NSTEMI, now s/p cath 7/3. Unable to access RCA lesion from radial approach and will need PCI of RCA from groin approach on 7/5. Pharmacy consulted to start heparin at 2300 per Cards (~6hrs post-sheath removal). Not on anticoagulation PTA.   HL 0.31, CBC wnl. Minor bleeding from IV Site  Goal of Therapy:  Heparin level 0.3-0.7 units/ml Monitor platelets by anticoagulation protocol: Yes   Plan:  Continue heparin at 1250 units/hr. 6h heparin level Monitor daily heparin level and CBC, s/sx bleeding PCI of RCA scheduled for 7/5  Jeanella Caraathy Eastyn Skalla, PharmD, [facility] Clinical Pharmacist 02/24/2018 12:23 PM

## 2018-02-24 NOTE — Progress Notes (Signed)
Patient's HR 56. Bodenheimer NP paged. Verbal order received to hold metoprolol dose for tonight. No other complaints at this time. Will continue to monitor patient.

## 2018-02-24 NOTE — Progress Notes (Signed)
ANTICOAGULATION CONSULT NOTE - Follow Up Consult  Pharmacy Consult for heparin Indication: NSTEMI/CAD  Labs: Recent Labs    02/22/18 2305 02/23/18 0516 02/23/18 1030 02/23/18 1242 02/23/18 1844 02/24/18 0534  HGB 13.8 13.2  --   --   --  13.3  HCT 42.8 40.5  --   --   --  41.0  PLT 211 220  --   --   --  210  LABPROT  --   --   --  13.1  --   --   INR  --   --   --  1.00  --   --   HEPARINUNFRC  --   --   --   --   --  0.12*  CREATININE 0.65 0.62  --   --   --   --   TROPONINI  --  0.70* 1.23*  --  0.70*  --     Assessment: 47yo female subtherapeutic on heparin with initial dosing post-cath.  Goal of Therapy:  Heparin level 0.3-0.7 units/ml   Plan:  Will increase heparin gtt by 3 units/kg/hr to 1250 units/hr and check level in 6 hours.    Vernard GamblesVeronda Deniqua Perry, PharmD, BCPS  02/24/2018,6:04 AM

## 2018-02-24 NOTE — Progress Notes (Signed)
ANTICOAGULATION CONSULT NOTE - FOLLOW UP    HL = 0.4 (goal 0.3 - 0.7 units/mL) Heparin dosing weight = 80 kg   Assessment: 47 YOF s/p cath to continue on IV heparin while awaiting PCI of RCA from groin approach on 02/25/18.  Heparin level is therapeutic; no bleeding reported.   Plan: Continue heparin gtt at 1250 units/hr F/U AM labs   Galya Dunnigan D. Laney Potashang, PharmD, BCPS, BCCCP 02/24/2018, 8:18 PM

## 2018-02-24 NOTE — H&P (View-Only) (Signed)
Progress Note  Patient Name: Oneita KrasHeather L Billinger Date of Encounter: 02/24/2018  Primary Cardiologist: Verdis PrimeHenry Smith  Subjective   No chest pain this am. No dyspnea. She is anxious  Inpatient Medications    Scheduled Meds: . aspirin EC  81 mg Oral Daily  . escitalopram  20 mg Oral Daily  . metoprolol tartrate  25 mg Oral BID  . pantoprazole  40 mg Oral Daily  . simvastatin  40 mg Oral QHS  . sodium chloride flush  3 mL Intravenous Q12H  . ticagrelor  90 mg Oral BID   Continuous Infusions: . sodium chloride    . heparin 1,250 Units/hr (02/24/18 0636)   PRN Meds: sodium chloride, acetaminophen, ALPRAZolam, ondansetron (ZOFRAN) IV, sodium chloride flush   Vital Signs    Vitals:   02/23/18 1930 02/23/18 2007 02/23/18 2353 02/24/18 0443  BP: (!) 142/86 135/74 137/65 (!) 142/83  Pulse: 74 73 66 62  Resp:   16 18  Temp:   98.2 F (36.8 C) 98.2 F (36.8 C)  TempSrc:   Oral Oral  SpO2:   97% 98%  Weight:    235 lb 3.2 oz (106.7 kg)  Height:        Intake/Output Summary (Last 24 hours) at 02/24/2018 0742 Last data filed at 02/24/2018 0636 Gross per 24 hour  Intake 1263.41 ml  Output -  Net 1263.41 ml   Filed Weights   02/23/18 0319 02/24/18 0443  Weight: 239 lb 6.4 oz (108.6 kg) 235 lb 3.2 oz (106.7 kg)    Telemetry    Sinus - Personally Reviewed  ECG    No AM EKG - Personally Reviewed  Physical Exam    General: Well developed, well nourished, NAD  HEENT: OP clear, mucus membranes moist  SKIN: warm, dry. No rashes. Neuro: No focal deficits  Musculoskeletal: Muscle strength 5/5 all ext  Psychiatric: Mood and affect normal  Neck: No JVD, no carotid bruits, no thyromegaly, no lymphadenopathy.  Lungs:Clear bilaterally, no wheezes, rhonci, crackles Cardiovascular: Regular rate and rhythm. No murmurs, gallops or rubs. Abdomen:Soft. Bowel sounds present. Non-tender.  Extremities: No lower extremity edema. Pulses are 2 + in the bilateral DP/PT. Right radial cath  site without hematoma.    Labs    Chemistry Recent Labs  Lab 02/22/18 2305 02/23/18 0516 02/24/18 0534  NA 140  --  136  K 3.7  --  3.2*  CL 108  --  109  CO2 25  --  21*  GLUCOSE 114*  --  107*  BUN 7  --  7  CREATININE 0.65 0.62 0.66  CALCIUM 8.5*  --  8.3*  GFRNONAA >60 >60 >60  GFRAA >60 >60 >60  ANIONGAP 7  --  6     Hematology Recent Labs  Lab 02/22/18 2305 02/23/18 0516 02/24/18 0534  WBC 9.6 9.6 9.4  RBC 4.42 4.23 4.25  HGB 13.8 13.2 13.3  HCT 42.8 40.5 41.0  MCV 96.8 95.7 96.5  MCH 31.2 31.2 31.3  MCHC 32.2 32.6 32.4  RDW 14.2 14.0 14.1  PLT 211 220 210    Cardiac Enzymes Recent Labs  Lab 02/23/18 0516 02/23/18 1030 02/23/18 1844  TROPONINI 0.70* 1.23* 0.70*    Recent Labs  Lab 02/22/18 2313 02/23/18 0214  TROPIPOC 0.08 0.56*     BNPNo results for input(s): BNP, PROBNP in the last 168 hours.   DDimer No results for input(s): DDIMER in the last 168 hours.   Radiology  Dg Chest 2 View  Result Date: 02/22/2018 CLINICAL DATA:  Chest pain, left jaw pain, and left arm pain tonight. EXAM: CHEST - 2 VIEW COMPARISON:  None. FINDINGS: Normal heart size and pulmonary vascularity. No focal airspace disease or consolidation in the lungs. No blunting of costophrenic angles. No pneumothorax. Mediastinal contours appear intact. IMPRESSION: No active disease. Electronically Signed   By: Burman Nieves M.D.   On: 02/22/2018 23:30    Cardiac Studies   Echo 02/23/18: - Left ventricle: The cavity size was normal. There was mild   concentric hypertrophy. Systolic function was vigorous. The   estimated ejection fraction was in the range of 65% to 70%. Wall   motion was normal; there were no regional wall motion   abnormalities. Left ventricular diastolic function parameters   were normal. - Aortic valve: There was no regurgitation. - Aortic root: The aortic root was normal in size. - Right ventricle: The cavity size was normal. Wall thickness was    normal. Systolic function was normal. - Right atrium: The atrium was normal in size. - Tricuspid valve: There was trivial regurgitation. - Pulmonic valve: There was no regurgitation. - Pulmonary arteries: Systolic pressure was within the normal   range. - Inferior vena cava: The vessel was normal in size. - Pericardium, extracardiac: There was no pericardial effusion.  Impressions:  - Normal study. There is no significant change since the prior   study on 09/16/2012.  Cardiac cath 02/23/18:  Ost RCA lesion is 90% stenosed.  Mid Cx lesion is 50% stenosed.  Ost LAD to Prox LAD lesion is 50% stenosed.   1. Severe stenosis in the ostium of the large, dominant RCA 2. Moderate non-obstructive disease in the proximal and mid LAD and in the mid Circumflex artery 3. Normal filling pressures  Patient Profile     47 y.o. female with history of obesity, tobacco abuse and HLD admitted with a NSTEMI and found to have severe ostial RCA stenosis.   Assessment & Plan    1. CAD/NSTEMI: Cardiac cath 02/23/18 with severe ostial RCA stenosis. Cath from radial artery and unable to engage the RCA well. Plans to return to cath lab Friday 02/25/18 for staged PCI of the RCA from the groin approach. Will continue ASA, Brilinta, statin and beta blocker. I have reviewed the risks, indications, and alternatives to cardiac catheterization, possible angioplasty, and stenting with the patient. Risks include but are not limited to bleeding, infection, vascular injury, stroke, myocardial infection, arrhythmia, kidney injury, radiation-related injury in the case of prolonged fluoroscopy use, emergency cardiac surgery, and death. The patient understands the risks of serious complication is 1-2% or less with angioplasty/stenting.  2. Hypokalemia: Will replace potassium this am.   For questions or updates, please contact [facility] Please consult [email or web address] for contact info under Cardiology/STEMI.       Signed, Verne Carrow, MD  02/24/2018, 7:42 AM

## 2018-02-25 ENCOUNTER — Encounter (HOSPITAL_COMMUNITY)
Admission: EM | Disposition: A | Discharge status: Home / Self Care | Payer: Self-pay | Source: Home / Self Care | Attending: Internal Medicine

## 2018-02-25 ENCOUNTER — Encounter (HOSPITAL_COMMUNITY): Payer: Self-pay | Admitting: Cardiovascular Disease

## 2018-02-25 DIAGNOSIS — I249 Acute ischemic heart disease, unspecified: Secondary | ICD-10-CM

## 2018-02-25 HISTORY — PX: CARDIAC CATHETERIZATION: SHX172

## 2018-02-25 HISTORY — PX: CORONARY STENT INTERVENTION: CATH118234

## 2018-02-25 LAB — BASIC METABOLIC PANEL
Anion gap: 7 (ref 5–15)
BUN: 8 mg/dL (ref 6–20)
CALCIUM: 8.5 mg/dL — AB (ref 8.9–10.3)
CO2: 22 mmol/L (ref 22–32)
Chloride: 108 mmol/L (ref 98–111)
Creatinine, Ser: 0.6 mg/dL (ref 0.44–1.00)
GFR calc Af Amer: >60 mL/min (ref >60)
Glucose, Bld: 92 mg/dL (ref 70–99)
POTASSIUM: 3.9 mmol/L (ref 3.5–5.1)
Sodium: 137 mmol/L (ref 135–145)

## 2018-02-25 LAB — POCT ACTIVATED CLOTTING TIME: ACTIVATED CLOTTING TIME: 351 s

## 2018-02-25 LAB — CBC
HEMATOCRIT: 42.6 % (ref 36.0–46.0)
HEMOGLOBIN: 13.6 g/dL (ref 12.0–15.0)
MCH: 31 pg (ref 26.0–34.0)
MCHC: 31.9 g/dL (ref 30.0–36.0)
MCV: 97 fL (ref 78.0–100.0)
Platelets: 211 10*3/uL (ref 150–400)
RBC: 4.39 MIL/uL (ref 3.87–5.11)
RDW: 14 % (ref 11.5–15.5)
WBC: 9.3 10*3/uL (ref 4.0–10.5)

## 2018-02-25 LAB — HEPARIN LEVEL (UNFRACTIONATED): Heparin Unfractionated: 0.5 IU/mL (ref 0.30–0.70)

## 2018-02-25 SURGERY — CORONARY STENT INTERVENTION
Anesthesia: LOCAL

## 2018-02-25 MED ORDER — HYDRALAZINE HCL 20 MG/ML IJ SOLN: 5.0000 mg | INTRAMUSCULAR | Status: AC | PRN

## 2018-02-25 MED ORDER — LIDOCAINE HCL (PF) 1 % IJ SOLN
INTRAMUSCULAR | Status: DC | PRN
  Administered 2018-02-25: 15 mL

## 2018-02-25 MED ORDER — NITROGLYCERIN 1 MG/10 ML FOR IR/CATH LAB
INTRA_ARTERIAL | Status: DC | PRN
  Administered 2018-02-25: 200 ug via INTRACORONARY

## 2018-02-25 MED ORDER — MIDAZOLAM HCL 2 MG/2ML IJ SOLN
INTRAMUSCULAR | Status: AC
  Filled 2018-02-25: qty 2

## 2018-02-25 MED ORDER — BIVALIRUDIN TRIFLUOROACETATE 250 MG IV SOLR
INTRAVENOUS | Status: AC
  Filled 2018-02-25: qty 250

## 2018-02-25 MED ORDER — BIVALIRUDIN BOLUS VIA INFUSION - CUPID
INTRAVENOUS | Status: DC | PRN
  Administered 2018-02-25: 80.7 mg via INTRAVENOUS

## 2018-02-25 MED ORDER — NITROGLYCERIN 1 MG/10 ML FOR IR/CATH LAB
INTRA_ARTERIAL | Status: AC
  Filled 2018-02-25: qty 10

## 2018-02-25 MED ORDER — TICAGRELOR 90 MG PO TABS: 90.0000 mg | ORAL_TABLET | Freq: Two times a day (BID) | ORAL | 0 refills | Status: DC

## 2018-02-25 MED ORDER — SODIUM CHLORIDE 0.9 % IV SOLN
INTRAVENOUS | Status: AC
  Administered 2018-02-25: 12:00:00 via INTRAVENOUS

## 2018-02-25 MED ORDER — FENTANYL CITRATE (PF) 100 MCG/2ML IJ SOLN
INTRAMUSCULAR | Status: AC
  Filled 2018-02-25: qty 2

## 2018-02-25 MED ORDER — SODIUM CHLORIDE 0.9% FLUSH: 3.0000 mL | INTRAVENOUS | Status: DC | PRN

## 2018-02-25 MED ORDER — SODIUM CHLORIDE 0.9 % IV SOLN
250.0000 mL | INTRAVENOUS | Status: DC | PRN
Start: 2018-02-25 — End: 2018-02-26

## 2018-02-25 MED ORDER — ANGIOPLASTY BOOK
Freq: Once | Status: DC
  Filled 2018-02-25: qty 1

## 2018-02-25 MED ORDER — LABETALOL HCL 5 MG/ML IV SOLN: 10.0000 mg | INTRAVENOUS | Status: AC | PRN

## 2018-02-25 MED ORDER — METOPROLOL TARTRATE 25 MG PO TABS: 25.0000 mg | ORAL_TABLET | Freq: Two times a day (BID) | ORAL | 0 refills | Status: DC

## 2018-02-25 MED ORDER — FENTANYL CITRATE (PF) 100 MCG/2ML IJ SOLN
INTRAMUSCULAR | Status: DC | PRN
  Administered 2018-02-25 (×3): 25 ug via INTRAVENOUS

## 2018-02-25 MED ORDER — SODIUM CHLORIDE 0.9 % IV SOLN
INTRAVENOUS | Status: DC | PRN
  Administered 2018-02-25: 1.75 mg/kg/h via INTRAVENOUS

## 2018-02-25 MED ORDER — MIDAZOLAM HCL 2 MG/2ML IJ SOLN
INTRAMUSCULAR | Status: DC | PRN
  Administered 2018-02-25 (×3): 2 mg via INTRAVENOUS

## 2018-02-25 MED ORDER — SODIUM CHLORIDE 0.9% FLUSH
3.0000 mL | Freq: Two times a day (BID) | INTRAVENOUS | Status: DC
  Administered 2018-02-25 – 2018-02-26 (×2): 3 mL via INTRAVENOUS

## 2018-02-25 MED ORDER — LIDOCAINE HCL (PF) 1 % IJ SOLN
INTRAMUSCULAR | Status: AC
  Filled 2018-02-25: qty 30

## 2018-02-25 MED ORDER — HEPARIN (PORCINE) IN NACL 1000-0.9 UT/500ML-% IV SOLN
INTRAVENOUS | Status: AC
  Filled 2018-02-25: qty 1000

## 2018-02-25 MED FILL — Heparin Sod (Porcine)-NaCl IV Soln 1000 Unit/500ML-0.9%: INTRAVENOUS | Qty: 1000 | Status: AC

## 2018-02-25 SURGICAL SUPPLY — 23 items
BALLN SAPPHIRE 2.5X12 (BALLOONS) ×2
BALLN ~~LOC~~ EMERGE MR 4.5X8 (BALLOONS) ×2
BALLOON SAPPHIRE 2.5X12 (BALLOONS) ×1 IMPLANT
BALLOON ~~LOC~~ EMERGE MR 4.5X8 (BALLOONS) ×1 IMPLANT
CATH LAUNCHER 6FR 3DRIGHT (CATHETERS) ×1 IMPLANT
CATH LAUNCHER 6FR AL1 SH (CATHETERS) ×1 IMPLANT
CATH VISTA GUIDE 6FR JR4 (CATHETERS) ×2 IMPLANT
CATHETER LAUNCHER 6FR 3DRIGHT (CATHETERS) ×2
CATHETER LAUNCHER 6FR AL1 SH (CATHETERS) ×2
COVER PRB 48X5XTLSCP FOLD TPE (BAG) ×1 IMPLANT
COVER PROBE 5X48 (BAG) ×2
DEVICE CLOSURE PERCLS PRGLD 6F (VASCULAR PRODUCTS) ×1 IMPLANT
ELECT DEFIB PAD ADLT CADENCE (PAD) ×2 IMPLANT
KIT ENCORE 26 ADVANTAGE (KITS) ×2 IMPLANT
KIT HEART LEFT (KITS) ×2 IMPLANT
PACK CARDIAC CATHETERIZATION (CUSTOM PROCEDURE TRAY) ×2 IMPLANT
PERCLOSE PROGLIDE 6F (VASCULAR PRODUCTS) ×2
SHEATH PINNACLE 6F 10CM (SHEATH) ×2 IMPLANT
STENT SIERRA 4.00 X 12 MM (Permanent Stent) ×2 IMPLANT
TRANSDUCER W/STOPCOCK (MISCELLANEOUS) ×2 IMPLANT
TUBING CIL FLEX 10 FLL-RA (TUBING) ×2 IMPLANT
WIRE COUGAR XT STRL 190CM (WIRE) ×4 IMPLANT
WIRE EMERALD 3MM-J .035X150CM (WIRE) ×2 IMPLANT

## 2018-02-25 NOTE — Interval H&P Note (Signed)
Cath Lab Visit (complete for each Cath Lab visit)  Clinical Evaluation Leading to the Procedure:   ACS: Yes.    Non-ACS:    Anginal Classification: CCS IV  Anti-ischemic medical therapy: Minimal Therapy (1 class of medications)  Non-Invasive Test Results: No non-invasive testing performed  Prior CABG: No previous CABG      History and Physical Interval Note:  02/25/2018 9:27 AM  Adrienne [name]  has presented today for surgery, with the diagnosis of cad  The various methods of treatment have been discussed with the patient and family. After consideration of risks, benefits and other options for treatment, the patient has consented to  Procedure(s): CORONARY STENT INTERVENTION (N/A) as a surgical intervention .  The patient's history has been reviewed, patient examined, no change in status, stable for surgery.  I have reviewed the patient's chart and labs.  Questions were answered to the patient's satisfaction.     Tonny BollmanMichael Shannyn Jankowiak

## 2018-02-25 NOTE — Progress Notes (Signed)
Progress Note  Patient Name: Adrienne [name] Date of Encounter: 02/25/2018  Primary Cardiologist: Verdis Prime  Subjective   Quiet night.  No chest discomfort.  Inpatient Medications    Scheduled Meds: . [MAR Hold] aspirin EC  81 mg Oral Daily  . [MAR Hold] escitalopram  10 mg Oral Daily  . [MAR Hold] metoprolol tartrate  25 mg Oral BID  . [MAR Hold] pantoprazole  40 mg Oral Daily  . [MAR Hold] simvastatin  40 mg Oral QHS  . [MAR Hold] sodium chloride flush  3 mL Intravenous Q12H  . sodium chloride flush  3 mL Intravenous Q12H  . [MAR Hold] ticagrelor  90 mg Oral BID   Continuous Infusions: . [MAR Hold] sodium chloride    . sodium chloride    . bivalirudin (ANGIOMAX) infusion 5 mg/mL 1.75 mg/kg/hr (02/25/18 1002)  . heparin 1,250 Units/hr (02/25/18 0511)   PRN Meds: [ZOX Hold] sodium chloride, sodium chloride, [MAR Hold] acetaminophen, [MAR Hold] ALPRAZolam, bivalirudin (ANGIOMAX) infusion 5 mg/mL, bivalirudin, fentaNYL, lidocaine (PF), midazolam, nitroGLYCERIN, [MAR Hold] ondansetron (ZOFRAN) IV, [MAR Hold] sodium chloride flush, sodium chloride flush   Vital Signs    Vitals:   02/24/18 1953 02/24/18 2102 02/25/18 0500 02/25/18 0834  BP: 135/88  137/82 (!) 155/79  Pulse: (!) 56 (!) 56 66 62  Resp: 16  18 18   Temp: 97.7 F (36.5 C)  97.6 F (36.4 C) 98.8 F (37.1 C)  TempSrc: Oral  Oral Oral  SpO2: 98%  98% 98%  Weight:   237 lb 3.2 oz (107.6 kg)   Height:        Intake/Output Summary (Last 24 hours) at 02/25/2018 1024 Last data filed at 02/25/2018 0511 Gross per 24 hour  Intake 661.05 ml  Output -  Net 661.05 ml   Filed Weights   02/23/18 0319 02/24/18 0443 02/25/18 0500  Weight: 239 lb 6.4 oz (108.6 kg) 235 lb 3.2 oz (106.7 kg) 237 lb 3.2 oz (107.6 kg)    Telemetry    Sinus - Personally Reviewed  ECG    No new data- Personally Reviewed  Physical Exam    Currently in Cath Lab on table.  Labs    Chemistry Recent Labs  Lab 02/22/18 2305  02/23/18 0516 02/24/18 0534 02/25/18 0502  NA 140  --  136 137  K 3.7  --  3.2* 3.9  CL 108  --  109 108  CO2 25  --  21* 22  GLUCOSE 114*  --  107* 92  BUN 7  --  7 8  CREATININE 0.65 0.62 0.66 0.60  CALCIUM 8.5*  --  8.3* 8.5*  GFRNONAA >60 >60 >60 >60  GFRAA >60 >60 >60 >60  ANIONGAP 7  --  6 7     Hematology Recent Labs  Lab 02/23/18 0516 02/24/18 0534 02/25/18 0502  WBC 9.6 9.4 9.3  RBC 4.23 4.25 4.39  HGB 13.2 13.3 13.6  HCT 40.5 41.0 42.6  MCV 95.7 96.5 97.0  MCH 31.2 31.3 31.0  MCHC 32.6 32.4 31.9  RDW 14.0 14.1 14.0  PLT 220 210 211    Cardiac Enzymes Recent Labs  Lab 02/23/18 0516 02/23/18 1030 02/23/18 1844  TROPONINI 0.70* 1.23* 0.70*    Recent Labs  Lab 02/22/18 2313 02/23/18 0214  TROPIPOC 0.08 0.56*     BNPNo results for input(s): BNP, PROBNP in the last 168 hours.   DDimer No results for input(s): DDIMER in the last 168 hours.  Radiology    No results found.  Cardiac Studies   Echo 02/23/18: - Left ventricle: The cavity size was normal. There was mild   concentric hypertrophy. Systolic function was vigorous. The   estimated ejection fraction was in the range of 65% to 70%. Wall   motion was normal; there were no regional wall motion   abnormalities. Left ventricular diastolic function parameters   were normal. - Aortic valve: There was no regurgitation. - Aortic root: The aortic root was normal in size. - Right ventricle: The cavity size was normal. Wall thickness was   normal. Systolic function was normal. - Right atrium: The atrium was normal in size. - Tricuspid valve: There was trivial regurgitation. - Pulmonic valve: There was no regurgitation. - Pulmonary arteries: Systolic pressure was within the normal   range. - Inferior vena cava: The vessel was normal in size. - Pericardium, extracardiac: There was no pericardial effusion.  Impressions:  - Normal study. There is no significant change since the prior   study  on 09/16/2012.  Cardiac cath 02/23/18:  Ost RCA lesion is 90% stenosed.  Mid Cx lesion is 50% stenosed.  Ost LAD to Prox LAD lesion is 50% stenosed.   1. Severe stenosis in the ostium of the large, dominant RCA 2. Moderate non-obstructive disease in the proximal and mid LAD and in the mid Circumflex artery 3. Normal filling pressures  Patient Profile     47 y.o. female with history of obesity, tobacco abuse and HLD admitted with a NSTEMI and found to have severe ostial RCA stenosis.   Assessment & Plan    1. CAD/NSTEMI: Ostial RCA in setting of non-ST elevation myocardial infarction.  PCI with stenting currently underway.   2. Hypokalemia: Will replace potassium this am.  3.  Lipid status is not known.  Patient is currently on high intensity statin therapy..  Will get lipid panel prior to discharge.  If all goes well with PCI, plan discharge in a.m. on dual antiplatelet therapy, high intensity statin therapy, with plan for cardiac rehab and education concerning secondary risk modification.  For questions or updates, please contact [facility] Please consult [email or web address] for contact info under Cardiology/STEMI.      Signed, Lesleigh NoeHenry W Shantella Blubaugh III, MD  02/25/2018, 10:24 AM

## 2018-02-25 NOTE — Care Management Note (Addendum)
Case Management Note  Patient Details  Name: CORNELIA WALRAVEN MRN: [id number] Date of Birth: 15-Sep-1970  Subjective/Objective:  NSTEMI                  Action/Plan: NCM spoke to pt and provided with Brilinta $5 copay card. Pt lives at home with husband. [facility] has Brilinta in stock.   # 6. S/W NIDIA @ [facility] # [phone]    BRILINTA 90 MG TABS BID  COVER-YES  CO-PAY- $ 80.00  TIER- NO  PRIOR APPROVAL - NO    NO DEDUCTIBLE  OUT O POCKET : NOT MET   PREFERRED PHARMACY : YES [facility]   Expected Discharge Date:                Expected Discharge Plan:  Home/Self Care  In-House Referral:  NA  Discharge planning Services  CM Consult, Medication Assistance  Post Acute Care Choice:  NA Choice offered to:  NA  DME Arranged:  N/A DME Agency:  NA  HH Arranged:  NA HH Agency:  NA  Status of Service:  Completed, signed off  If discussed at Long Length of Stay Meetings, dates discussed:    Additional Comments:  Erenest Rasher, RN 02/25/2018, 4:22 PM

## 2018-02-25 NOTE — Discharge Summary (Signed)
Physician Discharge Summary  Adrienne [name] JXB:[id number] DOB: [birth year] DOA: 02/22/2018  PCP: Eartha Inch, MD  Admit date: 02/22/2018 Discharge date: 02/25/2018  Admitted From: Home  Disposition:  Home   Recommendations for Outpatient Follow-up and new medication changes:  1. Follow up with Dr. Cyndia Bent in 7 days 2. Patient has been placed on dual antiplatelet therapy aspirin and ticagrelor. 3. High potency statin with simvastatin 40 mg daily.  Home Health: no   Equipment/Devices: no    Discharge Condition: stable  CODE STATUS: full  Diet recommendation: Heart healthy   Brief/Interim Summary: 47 year old female who presented with chest pain.  She does have the significant past medical history for dyslipidemia, GERD, and tobacco abuse.  Reported intermittent chest pain for last 48 hours, retrosternal, radiated to the neck.  Lasting for about 2 hours with no associated symptoms.  On the initial physical examination blood pressure 141/85, heart rate 74, respiratory rate 18, temperature 98, oxygen saturation 97%.  Moist mucous membranes, lungs clear to auscultation bilaterally, heart S1-S2 present rhythmic, abdomen soft nontender, no lower extremity edema.  Sodium 140, potassium 3.7, chloride 108, bicarb 25, glucose 114, BUN 7, creatinine 0.65, troponin 0.7, 9.6, hemoglobin 13.8, hematocrit 42.8, platelets 211.  Chest x-ray with no infiltrates.  EKG sinus rhythm, low voltage, no ST elevations, no ST depressions, no significant T wave abnormalities.  Normal axis.  Patient was admitted to the hospital with a working diagnosis of atypical chest pain to rule out acute coronary syndrome.  1. Not ST elevation myocardial infarction.  Patient ruled in for acute coronary syndrome, peak troponin I 1.23, patient was placed on heparin drip, antiplatelet therapy with aspirin, metoprolol and high potency statin.  Patient underwent cardiac catheterization with coronary angiography, finding severe  stenosis in the ostium of the large dominant RCA, 90%, moderate nonobstructive disease in the proximal and mid LAD, mid circumflex, (ostial LAD to proximal LAD lesion 50% stenosed,  mid circumflex lesion with 50% stenosis).  She underwent PCI with drug-eluting stent successfully placed at the ostial RCA, Bethena [name]).  She will be discharged on dual antiplatelet therapy for a minimum of 12 months.   2.  Hypokalemia.  Potassium was corrected with potassium chloride, discharge potassium 3.9, renal function remained preserved with serum creatinine 0.60.  3.  Dyslipidemia.  Continue statin therapy, with simvastatin, for secondary prophylaxis, cardiovascular disease.   4.  Tobacco abuse.  Smoking cessation.  5.  Obesity.  Calculated BMI 40.7, encouraged lifestyle modification, follow-up as an outpatient..    Discharge Diagnoses:  Principal Problem:   Chest pain Active Problems:   HLD (hyperlipidemia)   GERD (gastroesophageal reflux disease)   ACS (acute coronary syndrome) (HCC)   NSTEMI (non-ST elevated myocardial infarction) [facility])    Discharge Instructions   Allergies as of 02/25/2018      Reactions   Ciprofloxacin Swelling   Tongue and lips swell   Amoxicillin Hives   Latex Itching, Rash   Latex allergy is to "condoms," but gloves are tolerated      Medication List    STOP taking these medications   esomeprazole 40 MG capsule Commonly known as:  NEXIUM     TAKE these medications   ALPRAZolam 1 MG tablet Commonly known as:  XANAX Take 0.5-1 mg by mouth 3 (three) times daily as needed for anxiety.   aspirin 81 MG tablet Take 81 mg by mouth daily.   escitalopram 20 MG tablet Commonly known as:  LEXAPRO Take 20 mg by mouth  daily.   fluticasone 50 MCG/ACT nasal spray Commonly known as:  FLONASE Place 1 spray into both nostrils daily.   ibuprofen 200 MG tablet Commonly known as:  ADVIL,MOTRIN Take 600-800 mg by mouth every 6 (six) hours as needed (for pain,  headaches, or cramps).   metoprolol tartrate 25 MG tablet Commonly known as:  LOPRESSOR Take 1 tablet (25 mg total) by mouth 2 (two) times daily.   PROAIR HFA 108 (90 Base) MCG/ACT inhaler Generic drug:  albuterol Inhale 1-2 puffs into the lungs every 6 (six) hours as needed for wheezing or shortness of breath.   simvastatin 40 MG tablet Commonly known as:  ZOCOR Take 40 mg by mouth at bedtime.   ticagrelor 90 MG Tabs tablet Commonly known as:  BRILINTA Take 1 tablet (90 mg total) by mouth 2 (two) times daily.       Allergies  Allergen Reactions  . Ciprofloxacin Swelling    Tongue and lips swell  . Amoxicillin Hives  . Latex Itching and Rash    Latex allergy is to "condoms," but gloves are tolerated    Consultations:  Cardiology    Procedures/Studies: Dg Chest 2 View  Result Date: 02/22/2018 CLINICAL DATA:  Chest pain, left jaw pain, and left arm pain tonight. EXAM: CHEST - 2 VIEW COMPARISON:  None. FINDINGS: Normal heart size and pulmonary vascularity. No focal airspace disease or consolidation in the lungs. No blunting of costophrenic angles. No pneumothorax. Mediastinal contours appear intact. IMPRESSION: No active disease. Electronically Signed   By: Burman NievesWilliam  Stevens M.D.   On: 02/22/2018 23:30       Subjective: Patient is feeling better, no nausea or vomiting, no chest pain or dyspnea.   Discharge Exam: Vitals:   02/25/18 1130 02/25/18 1134  BP: (!) 152/61   Pulse: 60   Resp: 14   Temp:  97.8 F (36.6 C)  SpO2: 98%    Vitals:   02/25/18 1046 02/25/18 1051 02/25/18 1130 02/25/18 1134  BP: (!) 147/96 (!) 166/89 (!) 152/61   Pulse: 62 69 60   Resp: 10 (!) 9 14   Temp:    97.8 F (36.6 C)  TempSrc:    Oral  SpO2: 96% 98% 98%   Weight:      Height:        General: Not in pain or dyspnea  Neurology: Awake and alert, non focal  E ENT: no pallor, no icterus, oral mucosa moist Cardiovascular: No JVD. S1-S2 present, rhythmic, no gallops, rubs, or  murmurs. No lower extremity edema. Pulmonary: vesicular breath sounds bilaterally, adequate air movement, no wheezing, rhonchi or rales. Gastrointestinal. Abdomen protuberant with no organomegaly, non tender, no rebound or guarding Skin. No rashes Musculoskeletal: no joint deformities   The results of significant diagnostics from this hospitalization (including imaging, microbiology, ancillary and laboratory) are listed below for reference.     Microbiology: No results found for this or any previous visit (from the past 240 hour(s)).   Labs: BNP (last 3 results) No results for input(s): BNP in the last 8760 hours. Basic Metabolic Panel: Recent Labs  Lab 02/22/18 2305 02/23/18 0516 02/24/18 0534 02/25/18 0502  NA 140  --  136 137  K 3.7  --  3.2* 3.9  CL 108  --  109 108  CO2 25  --  21* 22  GLUCOSE 114*  --  107* 92  BUN 7  --  7 8  CREATININE 0.65 0.62 0.66 0.60  CALCIUM 8.5*  --  8.3* 8.5*   Liver Function Tests: No results for input(s): AST, ALT, ALKPHOS, BILITOT, PROT, ALBUMIN in the last 168 hours. No results for input(s): LIPASE, AMYLASE in the last 168 hours. No results for input(s): AMMONIA in the last 168 hours. CBC: Recent Labs  Lab 02/22/18 2305 02/23/18 0516 02/24/18 0534 02/25/18 0502  WBC 9.6 9.6 9.4 9.3  HGB 13.8 13.2 13.3 13.6  HCT 42.8 40.5 41.0 42.6  MCV 96.8 95.7 96.5 97.0  PLT 211 220 210 211   Cardiac Enzymes: Recent Labs  Lab 02/23/18 0516 02/23/18 1030 02/23/18 1844  TROPONINI 0.70* 1.23* 0.70*   BNP: Invalid input(s): POCBNP CBG: No results for input(s): GLUCAP in the last 168 hours. D-Dimer No results for input(s): DDIMER in the last 72 hours. Hgb A1c No results for input(s): HGBA1C in the last 72 hours. Lipid Profile No results for input(s): CHOL, HDL, LDLCALC, TRIG, CHOLHDL, LDLDIRECT in the last 72 hours. Thyroid function studies No results for input(s): TSH, T4TOTAL, T3FREE, THYROIDAB in the last 72 hours.  Invalid  input(s): FREET3 Anemia work up No results for input(s): VITAMINB12, FOLATE, FERRITIN, TIBC, IRON, RETICCTPCT in the last 72 hours. Urinalysis No results found for: COLORURINE, APPEARANCEUR, LABSPEC, PHURINE, GLUCOSEU, HGBUR, BILIRUBINUR, KETONESUR, PROTEINUR, UROBILINOGEN, NITRITE, LEUKOCYTESUR Sepsis Labs Invalid input(s): PROCALCITONIN,  WBC,  LACTICIDVEN Microbiology No results found for this or any previous visit (from the past 240 hour(s)).   Time coordinating discharge: 45 minutes  SIGNED:   Coralie Keens, MD  [facility] 02/25/2018, 12:32 PM Pager [phone]  If 7PM-7AM, please contact night-coverage [email or web address] Password [email or web address]

## 2018-02-26 DIAGNOSIS — R079 Chest pain, unspecified: Secondary | ICD-10-CM

## 2018-02-26 LAB — CBC
HEMATOCRIT: 41.3 % (ref 36.0–46.0)
HEMOGLOBIN: 13.4 g/dL (ref 12.0–15.0)
MCH: 31.2 pg (ref 26.0–34.0)
MCHC: 32.4 g/dL (ref 30.0–36.0)
MCV: 96.3 fL (ref 78.0–100.0)
Platelets: 233 10*3/uL (ref 150–400)
RBC: 4.29 MIL/uL (ref 3.87–5.11)
RDW: 14.2 % (ref 11.5–15.5)
WBC: 9.1 10*3/uL (ref 4.0–10.5)

## 2018-02-26 LAB — BASIC METABOLIC PANEL
ANION GAP: 8 (ref 5–15)
BUN: 10 mg/dL (ref 6–20)
CHLORIDE: 109 mmol/L (ref 98–111)
CO2: 23 mmol/L (ref 22–32)
Calcium: 8.9 mg/dL (ref 8.9–10.3)
Creatinine, Ser: 0.65 mg/dL (ref 0.44–1.00)
GFR calc non Af Amer: >60 mL/min (ref >60)
Glucose, Bld: 89 mg/dL (ref 70–99)
Potassium: 3.9 mmol/L (ref 3.5–5.1)
Sodium: 140 mmol/L (ref 135–145)

## 2018-02-26 LAB — LIPID PANEL
CHOL/HDL RATIO: 3.4 ratio
Cholesterol: 175 mg/dL (ref 0–200)
HDL: 51 mg/dL (ref >40)
LDL CALC: 96 mg/dL (ref 0–99)
Triglycerides: 139 mg/dL (ref <150)
VLDL: 28 mg/dL (ref 0–40)

## 2018-02-26 NOTE — Progress Notes (Signed)
Patient is feeling well, no chest pain or dyspnea.  On her physical examination blood pressure is 129/67, heart rate 63, respiratory rate 19, oxygen saturation 98%, temperature 98.9.  Heart S1-S2 present and rhythmic, lungs clear to auscultation, abdomen soft, no lower extremity edema.  Patient will be discharged home today.  Follow-up as an outpatient.

## 2018-02-26 NOTE — Progress Notes (Signed)
CARDIAC REHAB PHASE I   PRE:  Rate/Rhythm: 68 SR  BP:  Sitting: 124/67      SaO2: 97 RA  MODE:  Ambulation: 400 ft 100 peak HR  POST:  Rate/Rhythm: 77 SR  BP:  Sitting: 142/83    SaO2: 98 RA   Pt ambulated 45700ft in hallway independently on RA. Pt denies CP or SOB. Pt and husband educated on importance of Brilinta, ASA, statin, and NTG. Pt has stent card at bedside. Pt given MI booklet, and heart healthy diet. Strongly encouraged smoking cessation, tip sheet and fake cigarette given. Reviewed restrictions and exercise guidelines with pt. Will refer to CRP II [place].   [id number] Reynold Boweneresa  Xylah Early, RN BSN 02/26/2018 8:57 AM

## 2018-02-26 NOTE — Progress Notes (Addendum)
Progress Note  Patient Name: Adrienne [name] Date of Encounter: 02/26/2018  Primary Cardiologist: Katrinka Blazing  Subjective   No further chest pain. Some mild SOB this morning. Has ambulated with cardiac rehab without issues. Post-cath labs stable. Status post PCI/DES to the RCA as below. Has a Brilinta card. BP reasonably controlled. Heart rate in the mid 60s bpm currently. Concern for possible sleep apnea, no prior sleep study.   Inpatient Medications    Scheduled Meds: . angioplasty book   Does not apply Once  . aspirin EC  81 mg Oral Daily  . escitalopram  10 mg Oral Daily  . metoprolol tartrate  25 mg Oral BID  . pantoprazole  40 mg Oral Daily  . simvastatin  40 mg Oral QHS  . sodium chloride flush  3 mL Intravenous Q12H  . ticagrelor  90 mg Oral BID   Continuous Infusions: . sodium chloride     PRN Meds: sodium chloride, acetaminophen, ALPRAZolam, ondansetron (ZOFRAN) IV, sodium chloride flush   Vital Signs    Vitals:   02/25/18 1134 02/25/18 1815 02/25/18 2000 02/26/18 0425  BP:  117/67 (!) 147/69 129/67  Pulse:  64 67 63  Resp:   (!) 21 19  Temp: 97.8 F (36.6 C) 98 F (36.7 C) 99 F (37.2 C) 98.9 F (37.2 C)  TempSrc: Oral Oral Oral Oral  SpO2:  96% 98% 98%  Weight:    238 lb 1.6 oz (108 kg)  Height:        Intake/Output Summary (Last 24 hours) at 02/26/2018 0946 Last data filed at 02/25/2018 1351 Gross per 24 hour  Intake -  Output 300 ml  Net -300 ml   Filed Weights   02/24/18 0443 02/25/18 0500 02/26/18 0425  Weight: 235 lb 3.2 oz (106.7 kg) 237 lb 3.2 oz (107.6 kg) 238 lb 1.6 oz (108 kg)    Telemetry    Sinus bradycardia into the upper 40s bpm overnight while sleeping to sinus rhythm in the mid to upper 60s bpm while awake - Personally Reviewed  ECG    NSR, 64 bpm, low voltage QRS, nonspecific st/t changes - Personally Reviewed  Physical Exam   GEN: No acute distress.   Neck: No JVD. Cardiac: RRR, no murmurs, rubs, or gallops. Right radial  cardiac cath site without bleeding, mild ecchymosis, swelling, erythema, or warmth. Right radial pulse 2+. Right femoral cardiac cath site without bleeding, mild ecchymosis, mild TTP, no erythema, swelling, or warmth. No bruit.  Respiratory: Clear to auscultation bilaterally.  GI: Soft, nontender, non-distended.   MS: No edema; No deformity. Neuro:  Alert and oriented x 3; Nonfocal.  Psych: Normal affect.  Labs    Chemistry Recent Labs  Lab 02/24/18 0534 02/25/18 0502 02/26/18 0417  NA 136 137 140  K 3.2* 3.9 3.9  CL 109 108 109  CO2 21* 22 23  GLUCOSE 107* 92 89  BUN 7 8 10   CREATININE 0.66 0.60 0.65  CALCIUM 8.3* 8.5* 8.9  GFRNONAA >60 >60 >60  GFRAA >60 >60 >60  ANIONGAP 6 7 8      Hematology Recent Labs  Lab 02/24/18 0534 02/25/18 0502 02/26/18 0417  WBC 9.4 9.3 9.1  RBC 4.25 4.39 4.29  HGB 13.3 13.6 13.4  HCT 41.0 42.6 41.3  MCV 96.5 97.0 96.3  MCH 31.3 31.0 31.2  MCHC 32.4 31.9 32.4  RDW 14.1 14.0 14.2  PLT 210 211 233    Cardiac Enzymes Recent Labs  Lab 02/23/18  2952 02/23/18 1030 02/23/18 1844  TROPONINI 0.70* 1.23* 0.70*    Recent Labs  Lab 02/22/18 2313 02/23/18 0214  TROPIPOC 0.08 0.56*     BNPNo results for input(s): BNP, PROBNP in the last 168 hours.   DDimer No results for input(s): DDIMER in the last 168 hours.   Radiology    No results found.  Cardiac Studies  Echo 02/23/2018: - Left ventricle: The cavity size was normal. There was mild concentric hypertrophy. Systolic function was vigorous. The estimated ejection fraction was in the range of 65% to 70%. Wall motion was normal; there were no regional wall motion abnormalities. Left ventricular diastolic function parameters were normal. - Aortic valve: There was no regurgitation. - Aortic root: The aortic root was normal in size. - Right ventricle: The cavity size was normal. Wall thickness was normal. Systolic function was normal. - Right atrium: The atrium was  normal in size. - Tricuspid valve: There was trivial regurgitation. - Pulmonic valve: There was no regurgitation. - Pulmonary arteries: Systolic pressure was within the normal range. - Inferior vena cava: The vessel was normal in size. - Pericardium, extracardiac: There was no pericardial effusion.  Impressions:  - Normal study. There is no significant change since the prior study on 09/16/2012.   LHC 02/23/2018: Conclusion     Ost RCA lesion is 90% stenosed.  Mid Cx lesion is 50% stenosed.  Ost LAD to Prox LAD lesion is 50% stenosed.   1. Severe stenosis in the ostium of the large, dominant RCA 2. Moderate non-obstructive disease in the proximal and mid LAD and in the mid Circumflex artery 3. Normal filling pressures  Recommendations: Unable to access the RCA well from the radial approach. PCI of the RCA will need to be performed from the groin approach. She is morbidly obese and I do not think attempting access in her femoral artery tonight after anti-coagulation is the best approach. She will be loaded with Brilinta tonight. I will place her on the schedule for PCI on Friday with Dr. Excell Seltzer.     LHC 02/25/2018: Conclusion     Ost LAD to Prox LAD lesion is 50% stenosed.  Mid Cx lesion is 50% stenosed.  Ost RCA lesion is 90% stenosed.  A drug-eluting stent was successfully placed using a STENT SIERRA 4.00 X 12 MM.  Post intervention, there is a 0% residual stenosis.   Successful stenting of the ostial RCA using a 4.0x12 mm Xience Sierra DES  Recommend uninterrupted dual antiplatelet therapy with Aspirin 81mg  daily and Ticagrelor 90mg  twice daily for a minimum of 12 months (ACS - Class I recommendation).     Patient Profile     47 y.o. female with history of obesity, tobacco abuse and HLD admitted with a NSTEMI and found to have severe ostial RCA stenosis.  Assessment & Plan    1. CAD/NSTEMI: -Currently, chest pain free -Troponin peaked at  1.23 -Status post PCI/DES to the ostial RCA as above with residual nonobstructive disease as detailed above with continued medical management -DAPT with ASA and Brilinta without interruption for at least the next 12 months -Aggressive lifestyle modification and secondary prevention including smoking cessation -Lopressor, simvastatin  -Cardiac rehab -Post-cath instructions discussed in detail  2. Hypokalemia: -Improved  3. Lipid status: -Check lipid panel this morning -Remains on simvastatin 40 mg daily  4. Tobacco abuse: -Cessation advised  5. Obesity: -Weight loss advised -Cardiac rehab as above  6. Snoring: -Concerning for sleep apnea -Needs outpatient sleep study  For questions or updates, please contact [facility] Please consult [email or web address] for contact info under Cardiology/STEMI.   Signed, Eula Listenyan Dunn, PA-C [facility] Pager: [phone] 02/26/2018, 9:46 AM  The patient was seen, examined and discussed with Eula Listenyan Dunn, PA-C and I agree with the above.   47 y.o. female with history of obesity, tobacco abuse and HLD admitted with a NSTEMI and found to have severe ostial RCA stenosis, s/p PCI/DES to the ostial RCA with residual nonobstructive disease, we will continue DAPT with ASA and Brilinta without interruption for at least the next 12 months, aggressive lifestyle modification and secondary prevention including smoking cessation, weight loss. Continue lopressor, simvastatin. Access site with no bleeding, good peripheral pulses. Crea 0.65, vitals normal, we will discharge today arrange for an outpatient follow-up and outpatient sleep study.  Tobias AlexanderKatarina Lashon Hillier, MD 02/26/2018

## 2018-02-26 NOTE — Discharge Instructions (Signed)
Heart Attack A heart attack (myocardial infarction, MI) causes damage to the heart that cannot be fixed. A heart attack often happens when a blood clot or other blockage cuts blood flow to the heart. When this happens, certain areas of the heart begin to die. This causes the pain you feel during a heart attack. Follow these instructions at home:  Take medicine as told by your doctor. You may need medicine to: ? Keep your blood from clotting too easily. ? Control your blood pressure. ? Lower your cholesterol. ? Control abnormal heart rhythms.  Change certain behaviors as told by your doctor. This may include: ? Quitting smoking. ? Being active. ? Eating a heart-healthy diet. Ask your doctor for help with this diet. ? Keeping a healthy weight. ? Keeping your diabetes under control. ? Lessening stress. ? Limiting how much alcohol you drink. Do not take these medicines unless your doctor says that you can:  Nonsteroidal anti-inflammatory drugs (NSAIDs). These include: ? Ibuprofen. ? Naproxen. ? Celecoxib.  Vitamin supplements that have vitamin A, vitamin E, or both.  Hormone therapy that contains estrogen with or without progestin.  Get help right away if:  You have sudden chest discomfort.  You have sudden discomfort in your: ? Arms. ? Back. ? Neck. ? Jaw.  You have shortness of breath at any time.  You have sudden sweating or clammy skin.  You feel sick to your stomach (nauseous) or throw up (vomit).  You suddenly get light-headed or dizzy.  You feel your heart beating fast or skipping beats. These symptoms may be an emergency. Do not wait to see if the symptoms will go away. Get medical help right away. Call your local emergency services (911 in the [place] Do not drive yourself to the hospital. This information is not intended to replace advice given to you by your health care provider. Make sure you discuss any questions you have with your health care  provider. Document Released: 02/09/2012 Document Revised: 01/16/2016 Document Reviewed: 10/13/2013 Elsevier Interactive Patient Education  2017 Elsevier Inc.  Non-ST Segment Elevation Heart Attack A heart attack (myocardial infarction) happens when some of the heart muscle is injured or dies because it does not get enough oxygen. A non-ST segment elevation heart attack is a type of heart attack. It happens when the body does not get enough oxygen because an artery carrying blood to the heart muscles (coronary artery) becomes partly or temporarily blocked. This type of heart attack is usually less severe than the type of heart attack in which a coronary artery becomes completely blocked. What are the causes? The most common cause of this condition is a blocked coronary artery. A coronary artery can become blocked from a gradual buildup of cholesterol, fat, and plaque. A blood clot can form over the plaque and block blood flow. What increases the risk? This condition is more likely to develop in:  Smokers.  Males.  Older adults.  Overweight and obese adults.  People with high blood pressure (hypertension), high cholesterol, or diabetes.  People with a family history of heart disease.  People who do not get enough exercise.  People who are under a lot of stress.  People who drink too much alcohol.  People who use illegal street drugs that increase the heart rate, such as cocaine and methamphetamines.  What are the signs or symptoms? Symptoms of this condition include:  Chest pain or a feeling of pressure in the chest. It may feel like something is  crushing or squeezing the chest.  Discomfort in the upper back or in the area between the shoulder blades.  Upper back pain.  Tingling in the hands and arms.  Shortness of breath.  Heartburn or indigestion.  Sudden cold sweats.  Unexplained sweating.  Sudden lightheadedness.  Unexplained feelings of nervousness or  anxiety.  Feeling of tiredness, or not feeling well.  How is this diagnosed? This condition is diagnosed based on a person's signs and symptoms and a physical exam. You may also have tests done, including:  Blood tests.  A chest X-ray.  An test to measure the electrical activity of the heart (electrocardiogram).  A test that uses sound waves to produce a picture of the heart (echocardiogram).  A test to look at the heart arteries (coronary angiogram).  If you are still having chest pain after 12-24 hours, or if your health care providers think your heart is at risk, you may have a procedure called cardiac catheterization. In this procedure, a long, thin tube is inserted into an artery in your groin and moved up to the arteries in your heart. This procedure helps your health care provider figure out the source of the problem. How is this treated? This condition may be treated with:  Bed rest in the hospital.  Medicines to relieve chest pain.  Medicines to protect the heart.  If you have a blockage, a procedure in which the artery is opened (angioplasty) and a stent is placed to keep the artery open.  After initial treatment you may need to take medicine to:  Keep your blood from clotting too easily.  Control your blood pressure.  Lower your cholesterol.  Control abnormal heart rhythms (arrhythmias).  Follow these instructions at home:  Take medicines only as directed by your health care provider.  Do not take the following medicines unless your health care provider approves: ? Nonsteroidal anti-inflammatory drugs (NSAIDs), such as ibuprofen, naproxen, or celecoxib. ? Vitamin supplements that contain vitamin A, vitamin E, or both. ? Hormone replacement therapy that contains estrogen with or without progestin.  Make lifestyle changes as directed by your health care provider. These may include: ? Using no tobacco products, including cigarettes, chewing tobacco, and  electronic cigarettes. If you are struggling to quit, ask your health care provider for help. ? Exercising as directed by your health care provider. Ask for a list of activities that are safe for you. ? Eating a heart-healthy diet. Work with a Museum/gallery exhibitions officerregistered dietitian to learn healthy eating options. ? Maintaining a healthy weight. ? Managing other medical conditions, like diabetes. ? Reducing stress. ? Limiting how much alcohol you drink as directed by your health care provider. Get help right away if:  You have any symptoms of this condition. This information is not intended to replace advice given to you by your health care provider. Make sure you discuss any questions you have with your health care provider. Document Released: 03/09/2005 Document Revised: 01/16/2016 Document Reviewed: 07/18/2014 Elsevier Interactive Patient Education  [employer]2018 [employer] Inc.   Please review your medications carefully as there are new medications as well as possible changes to medications that you were previously taking. It is very important that you take a baby aspirin (81 mg) once daily along with Brilinta 90 mg twice daily without missing any doses. If you have difficulty obtaining your medications, especially Brilinta, please call our office at [phone] anytime of day or night, 7 days per week for further instructions. A message has been sent  to our office to schedule your hospital follow up visit to be seen within 14 days of discharge. If you have not heard from our office by 03/02/18, please call the above number to schedule your appointment.     Coronary Angiogram With Stent, Care After This sheet gives you information about how to care for yourself after your procedure. Your health care provider may also give you more specific instructions. If you have problems or questions, contact your health care provider. What can I expect after the procedure? After your procedure, it is common to have:  Bruising  in the area where a small, thin tube (catheter) was inserted. This usually fades within 1-2 weeks.  Blood collecting in the tissue (hematoma) that may be painful to the touch. It should usually decrease in size and tenderness within 1-2 weeks.  Follow these instructions at home: Insertion area care  Do not take baths, swim, or use a hot tub until your health care provider approves.  You may shower 24-48 hours after the procedure or as directed by your health care provider.  Follow instructions from your health care provider about how to take care of your incision. Make sure you: ? Wash your hands with soap and water before you change your bandage (dressing). If soap and water are not available, use hand sanitizer. ? Change your dressing as told by your health care provider. ? Leave stitches (sutures), skin glue, or adhesive strips in place. These skin closures may need to stay in place for 2 weeks or longer. If adhesive strip edges start to loosen and curl up, you may trim the loose edges. Do not remove adhesive strips completely unless your health care provider tells you to do that.  Remove the bandage (dressing) and gently wash the catheter insertion site with plain soap and water.  Pat the area dry with a clean towel. Do not rub the area, because that may cause bleeding.  Do not apply powder or lotion to the incision area.  Check your incision area every day for signs of infection. Check for: ? More redness, swelling, or pain. ? More fluid or blood. ? Warmth. ? Pus or a bad smell. Activity  Do not drive for 24 hours if you were given a medicine to help you relax (sedative).  Do not lift anything that is heavier than 10 lb (4.5 kg) for 5 days after your procedure or as directed by your health care provider.  Ask your health care provider when it is okay for you: ? To return to work or school. ? To resume usual physical activities or sports. ? To resume sexual activity. Eating  and drinking  Eat a heart-healthy diet. This should include plenty of fresh fruits and vegetables.  Avoid the following types of food: ? Food that is high in salt. ? Canned or highly processed food. ? Food that is high in saturated fat or sugar. ? [employer].  Limit alcohol intake to no more than 1 drink a day for non-pregnant women and 2 drinks a day for men. One drink equals 12 oz of beer, 5 oz of wine, or 1 oz of hard liquor. Lifestyle  Do not use any products that contain nicotine or tobacco, such as cigarettes and e-cigarettes. If you need help quitting, ask your health care provider.  Take steps to manage and control your weight.  Get regular exercise.  Manage your blood pressure.  Manage other health problems, such as diabetes. General instructions  Take over-the-counter and prescription medicines only as told by your health care provider. Blood thinners may be prescribed after your procedure to improve blood flow through the stent.  If you need an MRI after your heart stent has been placed, be sure to tell the health care provider who orders the MRI that you have a heart stent.  Keep all follow-up visits as directed by your health care provider. This is important. Contact a health care provider if:  You have a fever.  You have chills.  You have increased bleeding from the catheter insertion area. Hold pressure on the area. Get help right away if:  You develop chest pain or shortness of breath.  You feel faint or you pass out.  You have unusual pain at the catheter insertion area.  You have redness, warmth, or swelling at the catheter insertion area.  You have drainage (other than a small amount of blood on the dressing) from the catheter insertion area.  The catheter insertion area is bleeding, and the bleeding does not stop after 30 minutes of holding steady pressure on the area.  You develop bleeding from any other place, such as from your rectum. There  may be bright red blood in your urine or stool, or it may appear as black, tarry stool. This information is not intended to replace advice given to you by your health care provider. Make sure you discuss any questions you have with your health care provider. Document Released: 02/27/2005 Document Revised: 05/07/2016 Document Reviewed: 05/07/2016 Elsevier Interactive Patient Education  2018 [employer].    Coronary Artery Disease, Female Coronary artery disease (CAD) is a condition in which the arteries that lead to the heart (coronary arteries) become narrow or blocked. The narrowing or blockage can lead to decreased blood flow to the heart. Prolonged reduced blood flow can cause a heart attack (myocardial infarction or MI). This condition may also be called coronary heart disease. Because CAD is the leading cause of death in women, it is important to understand what causes this condition and how it is treated. What are the causes? CAD is most often caused by atherosclerosis. This is the buildup of fat and cholesterol (plaque) on the inside of the arteries. Over time, the plaque may narrow or block the artery, reducing blood flow to the heart. Plaque can also become weak and break off within a coronary artery and cause a sudden blockage. Other less common causes of CAD include:  An embolism or blood clot in a coronary artery.  A tearing of the artery (spontaneous coronary artery dissection).  An aneurysm.  Inflammation (vasculitis) in the artery wall.  What increases the risk? The following factors may make you more likely to develop this condition:  Age. Women over age 37 are at a greater risk of CAD.  Family history of CAD.  High blood pressure (hypertension).  Diabetes.  High cholesterol levels.  Tobacco use.  Lack of exercise.  Menopause. ? All postmenopausal women are at greater risk of CAD. ? Women who have experienced menopause between the ages of 49-45 (early  menopause) are at a higher risk of CAD. ? Women who have experienced menopause before age 67 (premature menopause) are at a very high risk of CAD.  Excessive alcohol use  A diet high in saturated and trans fats, such as fried food and processed meat.  Other possible risk factors include:  High stress levels.  Depression  Obesity.  Sleep apnea.  What are the signs  or symptoms? Many people do not have any symptoms during the early stages of CAD. As the condition progresses, symptoms may include:  Chest pain (angina). The pain can: ? Feel like crushing or squeezing, or a tightness, pressure, fullness, or heaviness in the chest. ? Last more than a few minutes or can stop and recur. The pain tends to get worse with exercise or stress and to fade with rest.  Pain in the arms, neck, jaw, or back.  Unexplained heartburn or indigestion.  Shortness of breath.  Nausea.  Sudden cold sweats.  Sudden light-headedness.  Fluttering or fast heartbeat (palpitations).  Many women have chest discomfort and the other symptoms. However, women often have unusual (atypical) symptoms, such as:  Fatigue.  Vomiting.  Unexplained feelings of nervousness or anxiety.  Unexplained weakness.  Dizziness or fainting.  How is this diagnosed? This condition is diagnosed based on:  Your family and medical history.  A physical exam.  Tests, including: ? A test to check the electrical signals in your heart (electrocardiogram). ? Exercise stress test. This looks for signs of blockage when the heart is stressed with exercise, such as running on a treadmill. ? Pharmacologic stress test. This test looks for signs of blockage when the heart is being stressed with a medicine. ? Blood tests. ? Coronary angiogram. This is a procedure to look at the coronary arteries to see if there is any blockage. During this test, a dye is injected into your arteries so they appear on an X-ray. ? A test that uses  sound waves to take a picture of your heart (echocardiogram). ? Chest X-ray.  How is this treated? This condition may be treated by:  Healthy lifestyle changes to reduce risk factors.  Medicines such as: ? Antiplatelet medicines and blood-thinning medicines, such as aspirin. These help prevent blood clots. ? Nitroglycerin. ? Blood pressure medicines. ? Cholesterol-lowering medicine.  Coronary angioplasty and stenting. During this procedure, a thin, flexible tube is inserted through a blood vessel and into a blocked artery. A balloon or similar device on the end of the tube is inflated to open up the artery. In some cases, a small, mesh tube (stent) is inserted into the artery to keep it open.  Coronary artery bypass surgery. During this surgery, veins or arteries from other parts of the body are used to create a bypass around the blockage and allow blood to reach your heart.  Follow these instructions at home: Medicines  Take over-the-counter and prescription medicines only as told by your health care provider.  Do not take the following medicines unless your health care provider approves: ? NSAIDs, such as ibuprofen, naproxen, or celecoxib. ? Vitamin supplements that contain vitamin A, vitamin E, or both. ? Hormone replacement therapy that contains estrogen with or without progestin. Lifestyle  Follow an exercise program approved by your health care provider. Aim for 150 minutes of moderate exercise or 75 minutes of vigorous exercise each week.  Maintain a healthy weight or lose weight as approved by your health care provider.  Rest when you are tired.  Learn to manage stress or try to limit your stress. Ask your health care provider for suggestions if you need help.  Get screened for depression and seek treatment, if needed.  Do not use any products that contain nicotine or tobacco, such as cigarettes and e-cigarettes. If you need help quitting, ask your health care  provider.  Do not use illegal drugs. Eating and drinking  Follow a heart-healthy  diet. A dietitian can help educate you about healthy food options and changes. In general, eat plenty of fruits and vegetables, lean meats, and whole grains.  Avoid foods high in: ? Sugar. ? Salt (sodium). ? Saturated fats, such as processed or fatty meat. ? Trans fats, such as fried food.  Use healthy cooking methods such as roasting, grilling, broiling, baking, poaching, steaming, or stir-frying.  If you drink alcohol, and your health care provider approves, limit your alcohol intake to no more than 1 drink per day. One drink equals 12 ounces of beer, 5 ounces of wine, or 1 ounces of hard liquor. General instructions  Manage any other health conditions, such as hypertension and diabetes. These conditions affect your heart.  Your health care provider may ask you to monitor your blood pressure. Ideally, your blood pressure should be below 130/80.  Keep all follow-up visits as told by your health care provider. This is important. Get help right away if:  You have pain in your chest, neck, arm, jaw, stomach, or back that: ? Lasts more than a few minutes. ? Is recurring. ? Is not relieved by taking medicine under your tongue (sublingualnitroglycerin).  You have profuse sweating without cause.  You have unexplained: ? Heartburn or indigestion. ? Shortness of breath or difficulty breathing. ? Fluttering or fast heartbeat (palpitations). ? Nausea or vomiting. ? Fatigue. ? Feelings of nervousness or anxiety. ? Weakness. ? Diarrhea.  You have sudden light-headedness or dizziness.  You faint.  You feel like hurting yourself or think about taking your own life. These symptoms may represent a serious problem that is an emergency. Do not wait to see if the symptoms will go away. Get medical help right away. Call your local emergency services (911 in the [place] Do not drive yourself to the  hospital. Summary  Coronary artery disease (CAD) is a process in which the arteries that lead to the heart (coronary arteries) become narrow or blocked. The narrowing or blockage can lead to a heart attack.  Many women have chest discomfort and other common symptoms of CAD. However, women often have different (atypical) symptoms, such as fatigue, vomiting, and dizziness or weakness.  CAD can be treated with lifestyle changes, medicines, surgery, or a combination of these treatments. This information is not intended to replace advice given to you by your health care provider. Make sure you discuss any questions you have with your health care provider. Document Released: 11/02/2011 Document Revised: 07/31/2016 Document Reviewed: 07/31/2016 Elsevier Interactive Patient Education  2018 [employer].     Angiogram, Care After This sheet gives you information about how to care for yourself after your procedure. Your doctor may also give you more specific instructions. If you have problems or questions, contact your doctor. Follow these instructions at home: Insertion site care  Follow instructions from your doctor about how to take care of your long, thin tube (catheter) insertion area. Make sure you: ? Wash your hands with soap and water before you change your bandage (dressing). If you cannot use soap and water, use hand sanitizer. ? Change your bandage as told by your doctor. ? Leave stitches (sutures), skin glue, or skin tape (adhesive) strips in place. They may need to stay in place for 2 weeks or longer. If tape strips get loose and curl up, you may trim the loose edges. Do not remove tape strips completely unless your doctor says it is okay.  Do not take baths, swim, or use a hot tub  until your doctor says it is okay.  You may shower 24-48 hours after the procedure or as told by your doctor. ? Gently wash the area with plain soap and water. ? Pat the area dry with a clean towel. ? Do  not rub the area. This may cause bleeding.  Do not apply powder or lotion to the area. Keep the area clean and dry.  Check your insertion area every day for signs of infection. Check for: ? More redness, swelling, or pain. ? Fluid or blood. ? Warmth. ? Pus or a bad smell. Activity  Rest as told by your doctor, usually for 1-2 days.  Do not lift anything that is heavier than 10 lbs. (4.5 kg) or as told by your doctor.  Do not drive for 24 hours if you were given a medicine to help you relax (sedative).  Do not drive or use heavy machinery while taking prescription pain medicine. General instructions  Go back to your normal activities as told by your doctor, usually in about a week. Ask your doctor what activities are safe for you.  If the insertion area starts to bleed, lie flat and put pressure on the area. If the bleeding does not stop, get help right away. This is an emergency.  Drink enough fluid to keep your pee (urine) clear or pale yellow.  Take over-the-counter and prescription medicines only as told by your doctor.  Keep all follow-up visits as told by your doctor. This is important. Contact a doctor if:  You have a fever.  You have chills.  You have more redness, swelling, or pain around your insertion area.  You have fluid or blood coming from your insertion area.  The insertion area feels warm to the touch.  You have pus or a bad smell coming from your insertion area.  You have more bruising around the insertion area.  Blood collects in the tissue around the insertion area (hematoma) that may be painful to the touch. Get help right away if:  You have a lot of pain in the insertion area.  The insertion area swells very fast.  The insertion area is bleeding, and the bleeding does not stop after holding steady pressure on the area.  The area near or just beyond the insertion area becomes pale, cool, tingly, or numb. These symptoms may be an emergency. Do  not wait to see if the symptoms will go away. Get medical help right away. Call your local emergency services (911 in the [place] Do not drive yourself to the hospital. Summary  After the procedure, it is common to have bruising and tenderness at the long, thin tube insertion area.  After the procedure, it is important to rest and drink plenty of fluids.  Do not take baths, swim, or use a hot tub until your doctor says it is okay to do so. You may shower 24-48 hours after the procedure or as told by your doctor.  If the insertion area starts to bleed, lie flat and put pressure on the area. If the bleeding does not stop, get help right away. This is an emergency. This information is not intended to replace advice given to you by your health care provider. Make sure you discuss any questions you have with your health care provider. Document Released: 11/06/2008 Document Revised: 08/04/2016 Document Reviewed: 08/04/2016 Elsevier Interactive Patient Education  2017 [employer] Inc[employer]    Cardiac Rehabilitation What is cardiac rehabilitation? Cardiac rehabilitation is a treatment program that  helps improve the health and well-being of people who have heart problems. Cardiac rehabilitation includes exercise training, education, and counseling to help you get stronger and return to an active lifestyle. This program can help you get better faster and reduce any future hospital stays. Why might I need cardiac rehabilitation?  Cardiac rehabilitation programs can help when you have or have had:  A heart attack.  Heart failure.  Peripheral artery disease.  Coronary artery disease.  Angina.  Lung or breathing problems.  Cardiac rehabilitation programs are also used when you have had:  Coronary artery bypass graft surgery.  Heart valve replacement.  Heart stent placement.  Heart transplant.  Aneurysm repair.  What are the benefits of cardiac rehabilitation? Cardiac rehabilitation can  help:  Reduce problems like chest pain and trouble breathing.  Change risk factors that contribute to heart disease, such as: ? Smoking. ? High blood pressure. ? High cholesterol. ? Diabetes. ? Being out of shape or not active. ? Weighing more than 30% higher than your ideal weight. ? Diet.  Improve your mental outlook so you feel: ? More hopeful. ? Better about yourself. ? More confident about taking care of yourself.  Get support from health experts as well as other people with similar problems.  Learn how to manage and understand your medicines.  Teach your family about your condition and how to participate in your recovery.  What happens in cardiac rehabilitation? You will be assessed by a cardiac rehabilitation team. They will check your health history and do a physical exam. You may need blood tests, stress tests, and other evaluations to make sure that you are ready to start cardiac rehabilitation. The cardiac rehabilitation team works with you to make a plan based on your health and goals. Your program will be tailored to fit you and your needs and may change as you progress. You may work with a health care team that includes:  Doctors.  Nurses.  Dietitians.  Psychologists.  Exercise specialists.  Physical and occupational therapists.  What are the phases of cardiac rehabilitation? A cardiac rehabilitation program is often divided into phases. You advance from one phase to the next. Phase One This phase starts while you are still in the hospital. You may start by walking in your room and then in the hall. You may start some simple exercises with a therapist. Phase Two This phase begins when you go home or to another facility. This phase may last 8-12 weeks. You will travel to a cardiac rehabilitation center or another place where rehabilitation is offered. You will slowly increase your activity level while being closely watched by a nurse or therapist. Exercises  may include a combination of strength or resistance training and cardio or aerobic movement on a treadmill or other machines. Your condition will determine how often and how long these sessions last. In phase two, you may learn how to cook healthy meals, control your blood sugar, and manage your medicines. You may need help with scheduling or planning how and when to take your medicines. If you have questions about your medicines, it is very important that you talk to your health care provider. Phase Three This phase continues for the rest of your life. There will be less supervision. You may still participate in cardiac rehabilitation activities or become part of a group in your community. You may benefit from talking about your experience with other people who are facing similar challenges. Get help right away if:  You have  severe chest discomfort, especially if the pain is crushing or pressure-like and spreads to your arms, back, neck, or jaw. Do not wait to see if the pain will go away.  You have weakness or numbness in your face, arms, or legs, especially on one side of the body.  Your speech is slurred.  You are confused.  You have a sudden severe headache or loss of vision.  You have shortness of breath.  You are sweating and have nausea.  You feel dizzy or faint.  You are fatigued. These symptoms may represent a serious problem that is an emergency. Do not wait to see if the symptoms will go away. Get medical help right away. Call your local emergency services (911 in the [place] Do not drive yourself to the hospital. This information is not intended to replace advice given to you by your health care provider. Make sure you discuss any questions you have with your health care provider. Document Released: 05/19/2008 Document Revised: 07/27/2016 Document Reviewed: 06/24/2015 Elsevier Interactive Patient Education  2018 [employer] Inc[employer]    Femoral Site Care Refer to this sheet in  the next few weeks. These instructions provide you with information about caring for yourself after your procedure. Your health care provider may also give you more specific instructions. Your treatment has been planned according to current medical practices, but problems sometimes occur. Call your health care provider if you have any problems or questions after your procedure. What can I expect after the procedure? After your procedure, it is typical to have the following:  Bruising at the site that usually fades within 1-2 weeks.  Blood collecting in the tissue (hematoma) that may be painful to the touch. It should usually decrease in size and tenderness within 1-2 weeks.  Follow these instructions at home:  Take medicines only as directed by your health care provider.  You may shower 24-48 hours after the procedure or as directed by your health care provider. Remove the bandage (dressing) and gently wash the site with plain soap and water. Pat the area dry with a clean towel. Do not rub the site, because this may cause bleeding.  Do not take baths, swim, or use a hot tub until your health care provider approves.  Check your insertion site every day for redness, swelling, or drainage.  Do not apply powder or lotion to the site.  Limit use of stairs to twice a day for the first 2-3 days or as directed by your health care provider.  Do not squat for the first 2-3 days or as directed by your health care provider.  Do not lift over 10 lb (4.5 kg) for 5 days after your procedure or as directed by your health care provider.  Ask your health care provider when it is okay to: ? Return to work or school. ? Resume usual physical activities or sports. ? Resume sexual activity.  Do not drive home if you are discharged the same day as the procedure. Have someone else drive you.  You may drive 24 hours after the procedure unless otherwise instructed by your health care provider.  Do not  operate machinery or power tools for 24 hours after the procedure or as directed by your health care provider.  If your procedure was done as an outpatient procedure, which means that you went home the same day as your procedure, a responsible adult should be with you for the first 24 hours after you arrive home.  Keep all  follow-up visits as directed by your health care provider. This is important. Contact a health care provider if:  You have a fever.  You have chills.  You have increased bleeding from the site. Hold pressure on the site. Get help right away if:  You have unusual pain at the site.  You have redness, warmth, or swelling at the site.  You have drainage (other than a small amount of blood on the dressing) from the site.  The site is bleeding, and the bleeding does not stop after 30 minutes of holding steady pressure on the site.  Your leg or foot becomes pale, cool, tingly, or numb. This information is not intended to replace advice given to you by your health care provider. Make sure you discuss any questions you have with your health care provider. Document Released: 04/13/2014 Document Revised: 01/16/2016 Document Reviewed: 02/27/2014 Elsevier Interactive Patient Education  2018 [employer] Inc[employer]   Radial Site Care Refer to this sheet in the next few weeks. These instructions provide you with information about caring for yourself after your procedure. Your health care provider may also give you more specific instructions. Your treatment has been planned according to current medical practices, but problems sometimes occur. Call your health care provider if you have any problems or questions after your procedure. What can I expect after the procedure? After your procedure, it is typical to have the following:  Bruising at the radial site that usually fades within 1-2 weeks.  Blood collecting in the tissue (hematoma) that may be painful to the touch. It should usually  decrease in size and tenderness within 1-2 weeks.  Follow these instructions at home:  Take medicines only as directed by your health care provider.  You may shower 24-48 hours after the procedure or as directed by your health care provider. Remove the bandage (dressing) and gently wash the site with plain soap and water. Pat the area dry with a clean towel. Do not rub the site, because this may cause bleeding.  Do not take baths, swim, or use a hot tub until your health care provider approves.  Check your insertion site every day for redness, swelling, or drainage.  Do not apply powder or lotion to the site.  Do not flex or bend the affected arm for 24 hours or as directed by your health care provider.  Do not push or pull heavy objects with the affected arm for 24 hours or as directed by your health care provider.  Do not lift over 10 lb (4.5 kg) for 5 days after your procedure or as directed by your health care provider.  Ask your health care provider when it is okay to: ? Return to work or school. ? Resume usual physical activities or sports. ? Resume sexual activity.  Do not drive home if you are discharged the same day as the procedure. Have someone else drive you.  You may drive 24 hours after the procedure unless otherwise instructed by your health care provider.  Do not operate machinery or power tools for 24 hours after the procedure.  If your procedure was done as an outpatient procedure, which means that you went home the same day as your procedure, a responsible adult should be with you for the first 24 hours after you arrive home.  Keep all follow-up visits as directed by your health care provider. This is important. Contact a health care provider if:  You have a fever.  You have chills.  You  have increased bleeding from the radial site. Hold pressure on the site. Get help right away if:  You have unusual pain at the radial site.  You have redness, warmth,  or swelling at the radial site.  You have drainage (other than a small amount of blood on the dressing) from the radial site.  The radial site is bleeding, and the bleeding does not stop after 30 minutes of holding steady pressure on the site.  Your arm or hand becomes pale, cool, tingly, or numb. This information is not intended to replace advice given to you by your health care provider. Make sure you discuss any questions you have with your health care provider. Document Released: 09/12/2010 Document Revised: 01/16/2016 Document Reviewed: 02/26/2014 Elsevier Interactive Patient Education  2018 [employer].

## 2018-02-28 MED FILL — Heparin Sod (Porcine)-NaCl IV Soln 1000 Unit/500ML-0.9%: INTRAVENOUS | Qty: 1000 | Status: AC

## 2018-03-01 ENCOUNTER — Telehealth (HOSPITAL_COMMUNITY): Payer: Self-pay

## 2018-03-01 ENCOUNTER — Telehealth: Payer: Self-pay | Admitting: Physician Assistant

## 2018-03-01 NOTE — Telephone Encounter (Signed)
New message   TOC appt 7/26 with Alben SpittleWeaver at 8:45    Scheduled per staff message Dunn, Raymon Muttonyan M, PA-C  [facility] Scheduling        Please schedule this patient for a TCM follow up to be seen within 14 days with Dr. Katrinka BlazingSmith or an APP.

## 2018-03-01 NOTE — Telephone Encounter (Signed)
Called patient to see if she is interested in the Cardiac Rehab Program - Patient stated she is interested. I explained to patient that she will need to schedule and complete her f/u appt with Dr.Smith before we can proceed with scheduling. Patient stated she will call this morning to schedule and call me back to inform me when her f/u appt is. Will await phone call.

## 2018-03-01 NOTE — Telephone Encounter (Signed)
**Note De-Identified Adrienne [name] Obfuscation** Patient [facility].  Patient understands to follow up with provider Tereso NewcomerScott Weaver, PA-c on 03/18/18 at 8:45 at [address] [place]. Patient understands discharge instructions? Yes Patient understands medications and regiment? Yes Patient understands to bring all medications to this visit? Yes

## 2018-03-01 NOTE — Telephone Encounter (Signed)
Patients insurance is active and benefits verified through [place] - No co-pay, deductible amount of $250.00/$64.88 has been met, out of pocket amount of $600.00/$321.93 has been met, 30% co-insurance, and no pre-authorization is required. Passport/reference [id number]  Will contact patient to see if she is interested in the Cardiac Rehab program. If interested, patient will need to schedule and complete follow up appt. Once completed, patient will be contacted for scheduling upon review by the RN Navigator.

## 2018-03-04 ENCOUNTER — Telehealth: Payer: Self-pay | Admitting: Physician Assistant

## 2018-03-04 MED ORDER — NITROGLYCERIN 0.4 MG SL SUBL: 0.4000 mg | SUBLINGUAL_TABLET | SUBLINGUAL | 6 refills | Status: DC | PRN

## 2018-03-04 NOTE — Telephone Encounter (Signed)
Spoke with patient who states she did not get a Rx for NTG when she was discharged [facility] s/p coronary intervention and she was advised to keep it on hand. I advised that I will send a Rx for her to pick up. She asks if she can have 1-2 beers on occasion. I advised that drinking in moderation is permissible but to monitor her drinking with her new medications, Brilinta and Metoprolol. She states she will be careful and just wanted to verify that occasional social drinking is allowed. States she has quit smoking cold [place] for which I congratulated and encouraged her. She is aware of follow-up appointment on 7/26 and thanked me for the call.

## 2018-03-04 NOTE — Telephone Encounter (Signed)
New Message:      Pt is calling to see if she can have some occasional alcoholic drinks since she has had a stent put in. Pt also states when she left the hospital they told her they would prescribe her some nitroglycerin but they never sent the medication to the pharmacy.

## 2018-03-18 ENCOUNTER — Encounter: Payer: Self-pay | Admitting: Physician Assistant

## 2018-03-18 ENCOUNTER — Telehealth: Payer: Self-pay | Admitting: *Deleted

## 2018-03-18 ENCOUNTER — Ambulatory Visit: Payer: BLUE CROSS/BLUE SHIELD | Admitting: Physician Assistant

## 2018-03-18 VITALS — BP 118/82 | HR 65 | Ht 64.0 in | Wt 240.1 lb

## 2018-03-18 DIAGNOSIS — E785 Hyperlipidemia, unspecified: Secondary | ICD-10-CM

## 2018-03-18 DIAGNOSIS — I214 Non-ST elevation (NSTEMI) myocardial infarction: Secondary | ICD-10-CM | POA: Diagnosis not present

## 2018-03-18 DIAGNOSIS — R0683 Snoring: Secondary | ICD-10-CM | POA: Diagnosis not present

## 2018-03-18 DIAGNOSIS — Z720 Tobacco use: Secondary | ICD-10-CM

## 2018-03-18 DIAGNOSIS — Z6841 Body Mass Index (BMI) 40.0 and over, adult: Secondary | ICD-10-CM

## 2018-03-18 DIAGNOSIS — I2510 Atherosclerotic heart disease of native coronary artery without angina pectoris: Secondary | ICD-10-CM

## 2018-03-18 HISTORY — DX: Atherosclerotic heart disease of native coronary artery without angina pectoris: I25.10

## 2018-03-18 MED ORDER — ATORVASTATIN CALCIUM 80 MG PO TABS: 80.0000 mg | ORAL_TABLET | Freq: Every day | ORAL | 3 refills | Status: DC

## 2018-03-18 MED ORDER — TICAGRELOR 90 MG PO TABS: 90.0000 mg | ORAL_TABLET | Freq: Two times a day (BID) | ORAL | 3 refills | Status: AC

## 2018-03-18 MED ORDER — METOPROLOL TARTRATE 25 MG PO TABS: 25.0000 mg | ORAL_TABLET | Freq: Two times a day (BID) | ORAL | 3 refills | Status: DC

## 2018-03-18 NOTE — Telephone Encounter (Signed)
-----   Message from Tarri Fullerarol M Fiato, CMA sent at 03/18/2018  9:43 AM EDT ----- Regarding: SPLIT NIGHT SLEEP STUDY  Hi ladies,  Pt saw Itta BenaScott W. PA to day and needs to be set up for a split night sleep study. Dx snoring. Pt has also had a NSTEMI if you need more diagnosis than just the snoring.  Thank you Okey Regalarol

## 2018-03-18 NOTE — Patient Instructions (Signed)
Medication Instructions:  1. REFILLS SENT IN FOR METOPROLOL AND BRILINTA    2. STOP SIMVASTATIN   3. START ATORVASTATIN 80 MG DAILY  Labwork: IN 3 MONTHS FASTING LIPID AND LIVER PANEL; NOTHING TO EAT AFTER MIDNIGHT THE NIGHT BEFORE THE LAB WORK. MORNING MEDICATIONS WITH WATER THE MORNING OF LAB WORK IS FINE.  Testing/Procedures: Your physician has recommended that you have a SPLIT NIGHT sleep study. This test records several body functions during sleep, including: brain activity, eye movement, oxygen and carbon dioxide blood levels, heart rate and rhythm, breathing rate and rhythm, the flow of air through your mouth and nose, snoring, body muscle movements, and chest and belly movement. NINA JONES, CMA WILL CALL YOU TO SET UP YOUR SLEEP STUDY    Follow-Up: DR. Katrinka BlazingSMITH IN 3 MONTHS   Any Other Special Instructions Will Be Listed Below (If Applicable).     If you need a refill on your cardiac medications before your next appointment, please call your pharmacy.

## 2018-03-18 NOTE — Progress Notes (Signed)
Cardiology Office Note:    Date:  03/18/2018   ID:  Adrienne [name], DOB 10-Jul-1971, MRN [id number]  PCP:  Eartha InchBadger, Michael C, MD  Cardiologist:  Lesleigh NoeHenry W Smith III, MD   Referring MD: Eartha InchBadger, Michael C, MD   Chief Complaint  Patient presents with  . Hospitalization Follow-up    Status post MI    History of Present Illness:    Adrienne [name] is a 47 y.o. female with hyperlipidemia, GERD and tobacco abuse.  She was admitted 7/2-7/5 with a non-ST elevation myocardial infarction.  Cardiac catheterization demonstrated high-grade ostial RCA stenosis.  However, this could not be accessed from the radial approach.  She returned to to the cardiac catheterization lab 2 days later and underwent successful PCI with drug-eluting stent to the RCA.  Records indicate there was noticeable snoring and outpatient sleep study was recommended.  Adrienne [name] returns for posthospitalization follow-up.  She is here alone.  Since discharge, she is doing well.  She has an occasional atypical left-sided chest pain.  However, she denies recurrent angina.  She denies orthopnea, PND, syncope, leg swelling.  She does note shortness of breath at unusual times.  She denies significant dyspnea exertion.  She is interested in cardiac rehabilitation.  Prior CV studies:   The following studies were reviewed today:  PCI: 02/25/2018 Successful stenting of the ostial RCA using a 4.0x12 mm Xience Sierra DES  Cardiac catheterization 02/23/2018 LAD ostial 50 LCx mid 50 RCA ostial 90 Unable to access the RCA well from the radial approach. PCI of the RCA will need to be performed from the groin approach  Echocardiogram 02/23/2018 Mild concentric LVH, EF 65-70, normal wall motion, normal diastolic function, trivial TR  Echocardiogram 09/09/2012 EF 65-70, normal wall motion, normal diastolic function   Past Medical History:  Diagnosis Date  . CAD (coronary artery disease) 03/18/2018   S/p NSTEMI 7/19: LHC - oLAD 50, mLCx  50, oRCA 90 >> PCI:  [facility] // Echo 7/19:  Mild concentric LVH, EF 65-70, normal wall motion, normal diastolic function, trivial TR  . Coronary artery disease   . GERD (gastroesophageal reflux disease)   . Hyperlipidemia    Surgical Hx: The patient  has a past surgical history that includes Cesarean section; finger surgery; LEFT HEART CATH AND CORONARY ANGIOGRAPHY (N/A, 02/23/2018); CORONARY STENT INTERVENTION (N/A, 02/25/2018); and Cardiac catheterization (02/25/2018).   Current Medications: Current Meds  Medication Sig  . albuterol (PROAIR HFA) 108 (90 Base) MCG/ACT inhaler Inhale 1-2 puffs into the lungs every 6 (six) hours as needed for wheezing or shortness of breath.  . ALPRAZolam (XANAX) 1 MG tablet Take 0.5-1 mg by mouth 3 (three) times daily as needed for anxiety.   Marland Kitchen. aspirin 81 MG tablet Take 81 mg by mouth daily.  . baclofen (LIORESAL) 10 MG tablet Take 10 mg by mouth as needed for muscle spasms.  Marland Kitchen. escitalopram (LEXAPRO) 20 MG tablet Take 20 mg by mouth daily.  Marland Kitchen. esomeprazole (NEXIUM) 40 MG capsule Take 1 capsule by mouth daily.  . fluticasone (FLONASE) 50 MCG/ACT nasal spray Place 1 spray into both nostrils daily.   Marland Kitchen. HYDROcodone-acetaminophen (NORCO/VICODIN) 5-325 MG tablet Take 1 tablet by mouth every 6 (six) hours as needed for moderate pain or severe pain.  Marland Kitchen. ibuprofen (ADVIL,MOTRIN) 200 MG tablet Take 600-800 mg by mouth every 6 (six) hours as needed (for pain, headaches, or cramps).  . metoprolol tartrate (LOPRESSOR) 25 MG tablet Take 1 tablet (25 mg total)  by mouth 2 (two) times daily.  . nitroGLYCERIN (NITROSTAT) 0.4 MG SL tablet Place 1 tablet (0.4 mg total) under the tongue every 5 (five) minutes as needed for chest pain.  . ranitidine (ZANTAC) 150 MG capsule Take 150 mg by mouth as needed for heartburn.  . simvastatin (ZOCOR) 40 MG tablet Take 40 mg by mouth at bedtime.   . ticagrelor (BRILINTA) 90 MG TABS tablet Take 1 tablet (90 mg total) by mouth 2 (two) times  daily.  . [DISCONTINUED] metoprolol tartrate (LOPRESSOR) 25 MG tablet Take 1 tablet (25 mg total) by mouth 2 (two) times daily.  . [DISCONTINUED] ticagrelor (BRILINTA) 90 MG TABS tablet Take 1 tablet (90 mg total) by mouth 2 (two) times daily.     Allergies:   Latex; Ciprofloxacin; and Amoxicillin   Social History   Tobacco Use  . Smoking status: Current Some Day Smoker    Types: Cigarettes  . Smokeless tobacco: Never Used  Substance Use Topics  . Alcohol use: No  . Drug use: No     Family Hx: The patient's family history includes CAD in her mother.  ROS:   Please see the history of present illness.    Review of Systems  Hematologic/Lymphatic: Bruises/bleeds easily.  Musculoskeletal: Positive for back pain.  Neurological: Positive for loss of balance.  Psychiatric/Behavioral: The patient is nervous/anxious.    All other systems reviewed and are negative.   EKGs/Labs/Other Test Reviewed:    EKG:  EKG is  ordered today.  The ekg ordered today demonstrates sinus bradycardia, heart rate 56, normal axis, QTC 399, nonspecific ST-T wave changes  Recent Labs: 02/26/2018: BUN 10; Creatinine, Ser 0.65; Hemoglobin 13.4; Platelets 233; Potassium 3.9; Sodium 140   Recent Lipid Panel Lab Results  Component Value Date/Time   CHOL 175 02/26/2018 04:17 AM   TRIG 139 02/26/2018 04:17 AM   HDL 51 02/26/2018 04:17 AM   CHOLHDL 3.4 02/26/2018 04:17 AM   LDLCALC 96 02/26/2018 04:17 AM    Physical Exam:    VS:  BP 118/82   Pulse 65   Ht 5\' 4"  (1.626 m)   Wt 240 lb 1.9 oz (108.9 kg)   SpO2 96%   BMI 41.22 kg/m     Wt Readings from Last 3 Encounters:  03/18/18 240 lb 1.9 oz (108.9 kg)  02/26/18 238 lb 1.6 oz (108 kg)  09/09/12 190 lb (86.2 kg)     Physical Exam  Constitutional: She is oriented to person, place, and time. She appears well-developed and well-nourished. No distress.  HENT:  Head: Normocephalic and atraumatic.  Eyes: No scleral icterus.  Neck: Neck supple. No  JVD present.  Cardiovascular: Normal rate, regular rhythm, S1 normal, S2 normal and normal heart sounds.  No murmur heard. Pulmonary/Chest: Effort normal. She has no rales.  Abdominal: Soft. There is no hepatomegaly.  Musculoskeletal: She exhibits no edema.  Right wrist without hematoma; right femoral arteriotomy site without hematoma or bruit  Neurological: She is alert and oriented to person, place, and time.  Skin: Skin is warm and dry.  Psychiatric: She has a normal mood and affect.    ASSESSMENT & PLAN:    NSTEMI (non-ST elevated myocardial infarction) (HCC) Status post non-ST elevation myocardial infarction treated with drug-eluting stent to the RCA.  She has residual, moderate nonobstructive disease in the LAD and LCx.  She is doing well without recurrent angina.  She is having some side effects to Brilinta.  I encouraged her to continue this medication  and contact us if her symptoms should become intolerable.  She is interested in cardiac rehabilitation.  She has quit smoking.  -Continue aspirin, ticagrelor, metoprolol, statin  Hyperlipidemia, unspecified hyperlipidemia type LDL 96 in the hospital.  I have recommended changing simvastatin to atorvastatin 80 mg daily to achieve goal LDL less than 70.  -DC simvastatin  -Start atorvastatin 80 mg daily  -Lipids, LFTs 3 months  Snoring She notes daytime hypersomnolence as well as reported, witnessed apnea.  She was noted to have significant snoring in the hospital.  She likely has sleep apnea.  -Arrange split-night sleep study  Tobacco abuse She has quit smoking.  She is occasionally using an electronic cigarette.  I have warned her that vaping is also linked with increased risk of myocardial infarction.  She plans to discontinue this in the future as well.  Morbid obesity I have encouraged her to attend cardiac rehabilitation.  We briefly discussed the importance of a good diet and weight loss.    Dispo:  Return in about 3  months (around 06/18/2018) for Routine Follow Up w/ Dr. Katrinka Blazing.   Medication Adjustments/Labs and Tests Ordered: Current medicines are reviewed at length with the patient today.  Concerns regarding medicines are outlined above.  Tests Ordered: Orders Placed This Encounter  Procedures  . Lipid Profile  . Hepatic function panel  . EKG 12-Lead  . Split night study   Medication Changes: Meds ordered this encounter  Medications  . atorvastatin (LIPITOR) 80 MG tablet    Sig: Take 1 tablet (80 mg total) by mouth daily.    Dispense:  90 tablet    Refill:  3    STOPPING SIMVASTATIN  . metoprolol tartrate (LOPRESSOR) 25 MG tablet    Sig: Take 1 tablet (25 mg total) by mouth 2 (two) times daily.    Dispense:  180 tablet    Refill:  3  . ticagrelor (BRILINTA) 90 MG TABS tablet    Sig: Take 1 tablet (90 mg total) by mouth 2 (two) times daily.    Dispense:  180 tablet    Refill:  3    Signed, Tereso Newcomer, PA-C  03/18/2018 1:43 PM    [facility] [address] [place], [place], [place]  [address] Phone: [phone]; Fax: [phone]

## 2018-03-21 ENCOUNTER — Telehealth (HOSPITAL_COMMUNITY): Payer: Self-pay

## 2018-03-21 NOTE — Telephone Encounter (Signed)
Called patient to schedule Cardiac Rehab - Patient asked that I call tomorrow at 11:30am as she is away from her scheduling book.

## 2018-03-22 ENCOUNTER — Telehealth: Payer: Self-pay | Admitting: *Deleted

## 2018-03-22 NOTE — Telephone Encounter (Signed)
-----   Message from Tarri Fullerarol M Fiato, CMA sent at 03/18/2018  9:43 AM EDT ----- Regarding: SPLIT NIGHT SLEEP STUDY  Hi ladies,  Pt saw [place] to day and needs to be set up for a split night sleep study. Dx snoring. Pt has also had a NSTEMI if you need more diagnosis than just the snoring.  Thank you Okey Regalarol

## 2018-03-22 NOTE — Telephone Encounter (Signed)
Staff message sent to Leslye Peerina BCBS denied in lab study. Approved HST. Ok to schedule HST. [facility]

## 2018-03-22 NOTE — Addendum Note (Signed)
Addended by: Tarri FullerFIATO, CAROL M on: 03/22/2018 11:45 AM   Modules accepted: Orders

## 2018-03-23 ENCOUNTER — Ambulatory Visit: Payer: BLUE CROSS/BLUE SHIELD | Admitting: Cardiology

## 2018-03-25 ENCOUNTER — Telehealth: Payer: Self-pay | Admitting: *Deleted

## 2018-03-25 NOTE — Telephone Encounter (Signed)
Patient is aware and agreeable to Home Sleep Study through [facility]. Patient is scheduled for Wednesday 04/20/18 at 1:15p to pick up home sleep kit and meet with Respiratory therapist at [facility]. Patient is aware that if this appointment date and time does not work for them they should contact Artis Delay directly at [phone]. Patient is aware that a sleep packet will be sent from [facility] in week. Patient is agreeable to treatment and thankful for call. Adrienne [name]

## 2018-03-25 NOTE — Telephone Encounter (Signed)
-----   Message from Gaynelle CageWanda M Waddell, CMA sent at 03/22/2018 11:03 AM EDT ----- Regarding: RE: SPLIT NIGHT SLEEP STUDY  BCBS denied in lab sleep study. Approved HST ok to schedule HST. Berkley Harveyauth # [id number] valid 03/22/18 to 05/20/18 ----- Message ----- From: Tarri FullerFiato, Carol M, CMA Sent: 03/18/2018   9:43 AM To: Reesa Cheworothea G Jorie Zee, CMA, Cv Div Sleep Studies Subject: SPLIT NIGHT SLEEP STUDY                        Hi ladies,  Pt saw Adrienne NeighborsScott W. PA to day and needs to be set up for a split night sleep study. Dx snoring. Pt has also had a NSTEMI if you need more diagnosis than just the snoring.  Thank you Okey Regalarol

## 2018-03-29 NOTE — Telephone Encounter (Signed)
Attempted to call patient in regards to Cardiac Rehab - LM on VM 

## 2018-04-06 ENCOUNTER — Telehealth (HOSPITAL_COMMUNITY): Payer: Self-pay

## 2018-04-06 NOTE — Telephone Encounter (Signed)
Patient stated she is on vacation and would like us to give her a call in a week.

## 2018-04-13 ENCOUNTER — Telehealth (HOSPITAL_COMMUNITY): Payer: Self-pay

## 2018-04-13 ENCOUNTER — Encounter (HOSPITAL_BASED_OUTPATIENT_CLINIC_OR_DEPARTMENT_OTHER): Payer: BLUE CROSS/BLUE SHIELD

## 2018-04-13 NOTE — Telephone Encounter (Signed)
Attempted to contact patient in regards to Cardiac Rehab - lm on vm °

## 2018-04-20 ENCOUNTER — Ambulatory Visit (HOSPITAL_BASED_OUTPATIENT_CLINIC_OR_DEPARTMENT_OTHER): Payer: BLUE CROSS/BLUE SHIELD | Attending: Physician Assistant | Admitting: Cardiology

## 2018-04-20 ENCOUNTER — Telehealth (HOSPITAL_COMMUNITY): Payer: Self-pay

## 2018-04-20 DIAGNOSIS — I214 Non-ST elevation (NSTEMI) myocardial infarction: Secondary | ICD-10-CM

## 2018-04-20 DIAGNOSIS — R0683 Snoring: Secondary | ICD-10-CM

## 2018-04-20 DIAGNOSIS — R0902 Hypoxemia: Secondary | ICD-10-CM | POA: Diagnosis not present

## 2018-04-20 DIAGNOSIS — G4733 Obstructive sleep apnea (adult) (pediatric): Secondary | ICD-10-CM

## 2018-04-20 DIAGNOSIS — G4736 Sleep related hypoventilation in conditions classified elsewhere: Secondary | ICD-10-CM | POA: Diagnosis not present

## 2018-04-20 DIAGNOSIS — Z6841 Body Mass Index (BMI) 40.0 and over, adult: Secondary | ICD-10-CM

## 2018-04-20 NOTE — Telephone Encounter (Signed)
2nd attempt to contact patient in regards to Cardiac Rehab - lm on vm. Sending letter. °

## 2018-04-24 NOTE — Procedures (Signed)
   Patient Name: Adrienne [name], Adrienne [name] Date: 04/21/2018 Gender: Female D.O.B: 1970-10-15 Age (years): 47 Referring Provider: Tereso Newcomer Height (inches): 64 Interpreting Physician: Armanda Magic MD, ABSM Weight (lbs): 240 RPSGT: Celene Kras BMI: 41 MRN: [id number] Neck Size: 16.00  CLINICAL INFORMATION Sleep Study Type: HST  Indication for sleep study: N/A  Epworth Sleepiness Score: 6  SLEEP STUDY TECHNIQUE A multi-channel overnight portable sleep study was performed. The channels recorded were: nasal airflow, thoracic respiratory movement, and oxygen saturation with a pulse oximetry. Snoring was also monitored.  MEDICATIONS Patient self administered medications include: N/A.  SLEEP ARCHITECTURE Patient was studied for 373.5 minutes. The sleep efficiency was 90.6 % and the patient was supine for 59.2%. The arousal index was 0.0 per hour.  RESPIRATORY PARAMETERS The overall AHI was 48.2 per hour, with a central apnea index of 0.0 per hour.  The oxygen nadir was 77% during sleep.  CARDIAC DATA Mean heart rate during sleep was 53.3 bpm.  IMPRESSIONS - Severe obstructive sleep apnea occurred during this study (AHI = 48.2/h). - No significant central sleep apnea occurred during this study (CAI = 0.0/h). - Severe oxygen desaturation was noted during this study (Min O2 = 77%). - Patient snored 26.7% during the sleep.  DIAGNOSIS - Obstructive Sleep Apnea (327.23 [G47.33 ICD-10]) - Nocturnal Hypoxemia (327.26 [G47.36 ICD-10])  RECOMMENDATIONS - In lab CPAP titration recommended due to severity of sleep disordered breathing.  - Positional therapy avoiding supine position during sleep. - Avoid alcohol, sedatives and other CNS depressants that may worsen sleep apnea and disrupt normal sleep architecture. - Sleep hygiene should be reviewed to assess factors that may improve sleep quality. - Weight management and regular exercise should be initiated or  continued.  [Electronically signed] 04/24/2018 10:32 PM  Armanda Magic MD, ABSM Diplomate, American Board of Sleep Medicine

## 2018-04-27 ENCOUNTER — Telehealth (HOSPITAL_COMMUNITY): Payer: Self-pay

## 2018-04-27 NOTE — Telephone Encounter (Signed)
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 Closed referral.

## 2018-04-28 ENCOUNTER — Telehealth: Payer: Self-pay | Admitting: *Deleted

## 2018-04-28 DIAGNOSIS — K219 Gastro-esophageal reflux disease without esophagitis: Secondary | ICD-10-CM

## 2018-04-28 NOTE — Telephone Encounter (Signed)
-----   Message from Quintella Reichert, MD sent at 04/24/2018 10:34 PM EDT ----- Please let patient know that they have sleep apnea and recommend CPAP titration. Please set up titration in the sleep lab.

## 2018-04-28 NOTE — Telephone Encounter (Signed)
Sent to precert 

## 2018-04-28 NOTE — Telephone Encounter (Signed)
Informed patient of sleep study results and patient understanding was verbalized. Patient understands her sleep study showed they have sleep apnea and recommend CPAP titration. Pt is aware and agreeable to these results. 

## 2018-04-29 ENCOUNTER — Telehealth: Payer: Self-pay | Admitting: *Deleted

## 2018-04-29 NOTE — Telephone Encounter (Signed)
PA request submitted to [facility] via web portal for in lab CPAP titration study.

## 2018-04-29 NOTE — Telephone Encounter (Signed)
-----   Message from Reesa Chew, CMA sent at 04/28/2018  6:04 PM EDT ----- Regarding: pre cert cpap titration

## 2018-05-04 ENCOUNTER — Telehealth: Payer: Self-pay | Admitting: *Deleted

## 2018-05-04 NOTE — Telephone Encounter (Signed)
-----   Message from Dorothea G Jones, CMA sent at 04/28/2018  6:04 PM EDT ----- Regarding: pre cert cpap titration 

## 2018-05-04 NOTE — Telephone Encounter (Signed)
BCBS denied in CPAP titration sleep study. Approved APAP to be done through [facility]. Auth # [id number]. Valid 05/04/18 to 08/01/18.

## 2018-05-05 ENCOUNTER — Telehealth: Payer: Self-pay | Admitting: *Deleted

## 2018-05-05 DIAGNOSIS — I2510 Atherosclerotic heart disease of native coronary artery without angina pectoris: Secondary | ICD-10-CM

## 2018-05-05 DIAGNOSIS — K219 Gastro-esophageal reflux disease without esophagitis: Secondary | ICD-10-CM

## 2018-05-05 NOTE — Telephone Encounter (Signed)
-----   Message from Gaynelle CageWanda M Waddell, CMA sent at 05/04/2018 10:25 AM EDT ----- Regarding: RE: pre cert BCBS denied CPAP titration. APAP recommended to be done by [facility]. ----- Message ----- From: Reesa ChewJones, Brayson Livesey G, CMA Sent: 04/28/2018   6:04 PM EDT To: Cv Div Sleep Studies Subject: pre cert                                       cpap titration

## 2018-05-10 NOTE — Addendum Note (Signed)
Addended by: Reesa ChewJONES, Erminie Foulks G on: 05/10/2018 02:09 PM   Modules accepted: Orders

## 2018-05-10 NOTE — Telephone Encounter (Signed)
APAP orders, sleep study, office notes, demographics and insurance information have been faxed to [facility]. [facility] will contact the patient for set up.

## 2018-05-20 NOTE — Telephone Encounter (Signed)
Upon patient request DME selection is [facility]. Patient understands he will be contacted by [facility] to set up his cpap. Patient understands to call if [facility] does not contact him with new setup in a timely manner. Patient understands they will be called once confirmation has been received from [facility] that they have received their new machine to schedule 10 week follow up appointment.  [facility] notified of new cpap order  Please add to airview Patient was grateful for the call and thanked me. 

## 2018-06-02 ENCOUNTER — Encounter: Payer: Self-pay | Admitting: *Deleted

## 2018-06-02 NOTE — Telephone Encounter (Signed)
Patient has a 10 week follow up appointment scheduled for .12/6 2019. Patient understands she needs to keep this appointment for insurance compliance. Patient was grateful for the call and thanked me.

## 2018-06-02 NOTE — Telephone Encounter (Signed)
This encounter was created in error - please disregard.

## 2018-06-08 ENCOUNTER — Telehealth: Payer: Self-pay | Admitting: Interventional Cardiology

## 2018-06-08 NOTE — Telephone Encounter (Signed)
Spoke to patient and let her know that there was no contraindication between her tanning and stent placement.  She verbalized understanding and was thankful for the call.

## 2018-06-08 NOTE — Telephone Encounter (Signed)
  Patient wants to know if she can go tanning with the stent?

## 2018-06-14 ENCOUNTER — Other Ambulatory Visit: Payer: BLUE CROSS/BLUE SHIELD

## 2018-06-14 DIAGNOSIS — E785 Hyperlipidemia, unspecified: Secondary | ICD-10-CM

## 2018-06-15 ENCOUNTER — Telehealth: Payer: Self-pay | Admitting: *Deleted

## 2018-06-15 LAB — LIPID PANEL
Chol/HDL Ratio: 2.8 ratio (ref 0.0–4.4)
Cholesterol, Total: 171 mg/dL (ref 100–199)
HDL: 62 mg/dL (ref >39)
LDL Calculated: 89 mg/dL (ref 0–99)
Triglycerides: 102 mg/dL (ref 0–149)
VLDL Cholesterol Cal: 20 mg/dL (ref 5–40)

## 2018-06-15 LAB — HEPATIC FUNCTION PANEL
ALK PHOS: 106 IU/L (ref 39–117)
ALT: 17 IU/L (ref 0–32)
AST: 18 IU/L (ref 0–40)
Albumin: 4 g/dL (ref 3.5–5.5)
BILIRUBIN TOTAL: 0.4 mg/dL (ref 0.0–1.2)
BILIRUBIN, DIRECT: 0.14 mg/dL (ref 0.00–0.40)
TOTAL PROTEIN: 6 g/dL (ref 6.0–8.5)

## 2018-06-15 NOTE — Telephone Encounter (Addendum)
   [facility] Pre-operative Risk Assessment    Request for surgical clearance:  1. What type of surgery is being performed? LEFT UPPER EYELID LESION EXCISION AND REPAIR ON 08/29/2018 AND LEFT ENDOSCOPIC DACRYOCYSTORHINOSTOMY WITH SILICONE TUBE INSERTION ON 09/08/2018   2. When is this surgery scheduled?  LEFT UPPER EYELID LESION EXCISION AND REPAIR ON 08/29/2018 AND LEFT ENDOSCOPIC DACRYOCYSTORHINOSTOMY WITH SILICONE TUBE INSERTION ON 09/08/2018   3. What type of clearance is required (medical clearance vs. Pharmacy clearance to hold med vs. Both)? BOTH  4. Are there any medications that need to be held prior to surgery and how long? ASPIRIN-WHEN IS THE PATIENT TO STOP ASPIRIN PRIOR TO SURGERY?   5. Practice name and name of physician performing surgery? [facility] --DR. RENZO ZALDIVAR   6. What is your office phone number [phone]    7.   What is your office fax number [phone]  8.   Anesthesia type (None, local, MAC, general) ?  MAC ON BOTH   Bettyjo Lundblad M 06/15/2018, 3:20 PM  _________________________________________________________________   (provider comments below)

## 2018-06-15 NOTE — Telephone Encounter (Signed)
That is ok.  Continue current therapy.  Discuss lipid management with Dr. Katrinka Blazing. Tereso Newcomer, PA-C    06/15/2018 11:10 AM

## 2018-06-15 NOTE — Telephone Encounter (Signed)
-----   Message from Beatrice Lecher, PA-C sent at 06/15/2018  8:52 AM EDT ----- Lipids ok but LDL should be < 70.  LFTs are normal. Recommendations:  - DC Atorvastatin (she can finish what she has before switching)  - Start Rosuvastatin 40 mg Once daily   - Lipids, LFTs in 3 months after starting Rosuvastatin.  Tereso Newcomer, PA-C    06/15/2018 8:51 AM

## 2018-06-15 NOTE — Telephone Encounter (Signed)
Pt has been notified of lab results by phone with verbal understanding. I began going over the recommendations with the pt to d/c Lipitor and start Crestor. Pt states to me that she is scared to take the Lipitor and that she has been taking her Simvastatin. Pt states she would like to d/w Dr. Katrinka Blazing further when she see's him 06/20/18. I stated that will be fine.

## 2018-06-16 NOTE — Telephone Encounter (Signed)
   Primary Cardiologist: Lesleigh Noe, MD  Chart reviewed as part of pre-operative protocol coverage. Dr. Katrinka Blazing, patient underwent successful stenting of the ostial RCA using a 4.0x12 mm Xience Sierra DES  02/25/18.   Recommend uninterrupted dual antiplatelet therapy with Aspirin 81mg  daily and Ticagrelor 90mg  twice daily for a minimum of 12 months (ACS - Class I recommendation).  Are you agree holding surgery until 02/26/2019? Please forward your response to P CV DIV PREOP.   Thank you  Manson Passey, PA 06/16/2018, 4:06 PM

## 2018-06-17 NOTE — Telephone Encounter (Signed)
   I will route this recommendation to the requesting party via Epic fax function and remove from pre-op pool.  Please call with questions.  [place], [place] 06/17/2018, 9:44 AM

## 2018-06-17 NOTE — Telephone Encounter (Signed)
Should wait until low risk of stent thrombosis unless this is urgent/time sensitive.

## 2018-06-19 DIAGNOSIS — I2510 Atherosclerotic heart disease of native coronary artery without angina pectoris: Secondary | ICD-10-CM | POA: Insufficient documentation

## 2018-06-19 NOTE — Progress Notes (Signed)
Cardiology Office Note:    Date:  06/20/2018   ID:  Adrienne [name], DOB 11-11-1970, MRN [id number]  PCP:  Eartha Inch, MD  Cardiologist:  Lesleigh Noe, MD   Referring MD: Eartha Inch, MD   Chief Complaint  Patient presents with  . Coronary Artery Disease    History of Present Illness:    Adrienne [name] is a 47 y.o. female with a hx of non STEMI, CAD with ostial RCA stent 02/2018, and hyperlipidemia.  No symptoms to suggest angina and denies palpitations.  Has not resumed smoking.  Lipids were not quite at target and we recommended atorvastatin 80 mg/day.  She will also remain on the 40 mg simvastatin.  I reassess the lipids and that would be okay.  I have asked that she cut back on animal fats and dairy products.  No medication side effects but has anxiety that she will have a serious reaction that will cause harm.  Significant depression and now on Lexapro.  Prior to her MI that was history of depression.  Past Medical History:  Diagnosis Date  . CAD (coronary artery disease) 03/18/2018   S/p NSTEMI 7/19: LHC - oLAD 50, mLCx 50, oRCA 90 >> PCI:  DES to [facility] // Echo 7/19:  Mild concentric LVH, EF 65-70, normal wall motion, normal diastolic function, trivial TR  . Coronary artery disease   . GERD (gastroesophageal reflux disease)   . Hyperlipidemia     Past Surgical History:  Procedure Laterality Date  . CARDIAC CATHETERIZATION  02/25/2018  . CESAREAN SECTION    . CORONARY STENT INTERVENTION N/A 02/25/2018   Procedure: CORONARY STENT INTERVENTION;  Surgeon: Tonny Bollman, MD;  Location: [facility] INVASIVE CV LAB;  Service: Cardiovascular;  Laterality: N/A;  . finger surgery    . LEFT HEART CATH AND CORONARY ANGIOGRAPHY N/A 02/23/2018   Procedure: LEFT HEART CATH AND CORONARY ANGIOGRAPHY;  Surgeon: Kathleene Hazel, MD;  Location: [facility] INVASIVE CV LAB;  Service: Cardiovascular;  Laterality: N/A;    Current Medications: Current Meds  Medication Sig  .  albuterol (PROAIR HFA) 108 (90 Base) MCG/ACT inhaler Inhale 1-2 puffs into the lungs every 6 (six) hours as needed for wheezing or shortness of breath.  . ALPRAZolam (XANAX) 1 MG tablet Take 0.5-1 mg by mouth 3 (three) times daily as needed for anxiety.   Marland Kitchen aspirin 81 MG tablet Take 81 mg by mouth daily.  . baclofen (LIORESAL) 10 MG tablet Take 10 mg by mouth as needed for muscle spasms.  Marland Kitchen escitalopram (LEXAPRO) 20 MG tablet Take 20 mg by mouth daily.  Marland Kitchen esomeprazole (NEXIUM) 40 MG capsule Take 1 capsule by mouth daily.  . fluticasone (FLONASE) 50 MCG/ACT nasal spray Place 1 spray into both nostrils daily.   Marland Kitchen HYDROcodone-acetaminophen (NORCO/VICODIN) 5-325 MG tablet Take 1 tablet by mouth every 6 (six) hours as needed for moderate pain or severe pain.  Marland Kitchen ibuprofen (ADVIL,MOTRIN) 200 MG tablet Take 600-800 mg by mouth every 6 (six) hours as needed (for pain, headaches, or cramps).  . metoprolol tartrate (LOPRESSOR) 25 MG tablet Take 25 mg by mouth 2 (two) times daily.  . nitroGLYCERIN (NITROSTAT) 0.4 MG SL tablet Place 1 tablet (0.4 mg total) under the tongue every 5 (five) minutes as needed for chest pain.  . ranitidine (ZANTAC) 150 MG capsule Take 150 mg by mouth as needed for heartburn.  . simvastatin (ZOCOR) 40 MG tablet Take 40 mg by mouth at bedtime.  Allergies:   Latex; Ciprofloxacin; and Amoxicillin   Social History   Socioeconomic History  . Marital status: Married    Spouse name: Not on file  . Number of children: Not on file  . Years of education: Not on file  . Highest education level: Not on file  Occupational History  . Not on file  Social Needs  . Financial resource strain: Not on file  . Food insecurity:    Worry: Not on file    Inability: Not on file  . Transportation needs:    Medical: Not on file    Non-medical: Not on file  Tobacco Use  . Smoking status: Former Smoker    Types: Cigarettes    Last attempt to quit: 02/22/2018    Years since quitting: 0.3    . Smokeless tobacco: Never Used  Substance and Sexual Activity  . Alcohol use: No  . Drug use: No  . Sexual activity: Not on file  Lifestyle  . Physical activity:    Days per week: Not on file    Minutes per session: Not on file  . Stress: Not on file  Relationships  . Social connections:    Talks on phone: Not on file    Gets together: Not on file    Attends religious service: Not on file    Active member of club or organization: Not on file    Attends meetings of clubs or organizations: Not on file    Relationship status: Not on file  Other Topics Concern  . Not on file  Social History Narrative  . Not on file     Family History: The patient's family history includes CAD in her mother.  ROS:   Please see the history of present illness.    Back pain is preventing significant physical activity.  Excessive sweating, and headaches.  Weight gain has been significant.  All other systems reviewed and are negative.  EKGs/Labs/Other Studies Reviewed:    The following studies were reviewed today: Diagnostic Diagram       Post-Intervention Diagram          EKG:  EKG is not ordered today.    Recent Labs: 02/26/2018: BUN 10; Creatinine, Ser 0.65; Hemoglobin 13.4; Platelets 233; Potassium 3.9; Sodium 140 06/14/2018: ALT 17  Recent Lipid Panel    Component Value Date/Time   CHOL 171 06/14/2018 1418   TRIG 102 06/14/2018 1418   HDL 62 06/14/2018 1418   CHOLHDL 2.8 06/14/2018 1418   CHOLHDL 3.4 02/26/2018 0417   VLDL 28 02/26/2018 0417   LDLCALC 89 06/14/2018 1418    Physical Exam:    VS:  BP 128/72   Pulse 68   Ht 5\' 4"  (1.626 m)   Wt 251 lb 12.8 oz (114.2 kg)   BMI 43.22 kg/m     Wt Readings from Last 3 Encounters:  06/20/18 251 lb 12.8 oz (114.2 kg)  03/18/18 240 lb 1.9 oz (108.9 kg)  02/26/18 238 lb 1.6 oz (108 kg)     GEN: Obese Well nourished, well developed in no acute distress HEENT: Normal NECK: No JVD. LYMPHATICS: No lymphadenopathy CARDIAC:  RRR, no murmur, no gallop, no  edema. VASCULAR: 2+ bilateral radial pulses.  No bruits. RESPIRATORY:  Clear to auscultation without rales, wheezing or rhonchi  ABDOMEN: Soft, non-tender, non-distended, No pulsatile mass, MUSCULOSKELETAL: No deformity  SKIN: Warm and dry NEUROLOGIC:  Alert and oriented x 3 PSYCHIATRIC:  Normal affect   ASSESSMENT:  1. NSTEMI (non-ST elevated myocardial infarction) (HCC)   2. Coronary artery disease of native artery of native heart with stable angina pectoris (HCC)   3. Mixed hyperlipidemia   4. Snoring    PLAN:    In order of problems listed above:  1. No recurrence of prolonged chest pain 2. CAD secondary risk modification discussed.  She is doing relatively well in all areas with the exception of aerobic activity greater than 150 minutes/week, weight loss, and LDL which is greater than 70.  We discussed progression.  Continue dual antiplatelet therapy. 3. Most recent LDL was 82.  Continue simvastatin.  Cut back on animal fats. 4. Continue CPAP.  Not well adjusted to CPAP.  Attempting to be compliant. 5. Prior history of depression.  Anxiety concerning disease process and recurrence.  Also very anxious about medications and potential side effects.  Not sleeping well.  She needs more physical activity, totaling greater than 150 minutes/week.  She has stopped smoking cigarettes as of her myocardial infarction and has not resumed.  LDL target of 70 has not quite been achieved but close on current statin dose.  She is reluctant to change therapy because of fear that side effects may occur.  Hemoglobin A1c was not significantly elevated.  Blood pressure is adequately controlled.  She is contemplating plastic surgery will need surgery for a left eye problem.  She will be able to pause dual antiplatelet therapy starting 6 months after stent implantation in January.  Greater than 50% of the time during this office visit was spent in education, counseling, and  coordination of care related to underlying disease process and testing as outlined.    Medication Adjustments/Labs and Tests Ordered: Current medicines are reviewed at length with the patient today.  Concerns regarding medicines are outlined above.  No orders of the defined types were placed in this encounter.  No orders of the defined types were placed in this encounter.   Patient Instructions  Medication Instructions:  Your physician recommends that you continue on your current medications as directed. Please refer to the Current Medication list given to you today.  If you need a refill on your cardiac medications before your next appointment, please call your pharmacy.   Lab work: none If you have labs (blood work) drawn today and your tests are completely normal, you will receive your results only by: Marland Kitchen MyChart Message (if you have MyChart) OR . A paper copy in the mail If you have any lab test that is abnormal or we need to change your treatment, we will call you to review the results.  Testing/Procedures: none  Follow-Up: At [facility], you and your health needs are our priority.  As part of our continuing mission to provide you with exceptional heart care, we have created designated Provider Care Teams.  These Care Teams include your primary Cardiologist (physician) and Advanced Practice Providers (APPs -  Physician Assistants and Nurse Practitioners) who all work together to provide you with the care you need, when you need it. You will need a follow up appointment in 6 months.  Please call our office 2 months in advance to schedule this appointment.  You may see Lesleigh Noe, MD or one of the following Advanced Practice Providers on your designated Care Team:   Norma Fredrickson, NP Nada Boozer, NP . Georgie Chard, NP  Any Other Special Instructions Will Be Listed Below (If Applicable).       Signed, Lyn Records III,  MD  06/20/2018 5:26 PM    [facility]

## 2018-06-20 ENCOUNTER — Encounter: Payer: Self-pay | Admitting: Interventional Cardiology

## 2018-06-20 ENCOUNTER — Ambulatory Visit: Payer: BLUE CROSS/BLUE SHIELD | Admitting: Interventional Cardiology

## 2018-06-20 VITALS — BP 128/72 | HR 68 | Ht 64.0 in | Wt 251.8 lb

## 2018-06-20 DIAGNOSIS — I214 Non-ST elevation (NSTEMI) myocardial infarction: Secondary | ICD-10-CM

## 2018-06-20 DIAGNOSIS — E782 Mixed hyperlipidemia: Secondary | ICD-10-CM

## 2018-06-20 DIAGNOSIS — I25118 Atherosclerotic heart disease of native coronary artery with other forms of angina pectoris: Secondary | ICD-10-CM | POA: Diagnosis not present

## 2018-06-20 DIAGNOSIS — F32A Depression, unspecified: Secondary | ICD-10-CM

## 2018-06-20 DIAGNOSIS — F419 Anxiety disorder, unspecified: Secondary | ICD-10-CM

## 2018-06-20 DIAGNOSIS — F329 Major depressive disorder, single episode, unspecified: Secondary | ICD-10-CM

## 2018-06-20 DIAGNOSIS — R0683 Snoring: Secondary | ICD-10-CM

## 2018-06-20 NOTE — Patient Instructions (Signed)

## 2018-07-29 ENCOUNTER — Encounter: Payer: Self-pay | Admitting: Cardiology

## 2018-07-29 ENCOUNTER — Ambulatory Visit (INDEPENDENT_AMBULATORY_CARE_PROVIDER_SITE_OTHER): Payer: BLUE CROSS/BLUE SHIELD | Admitting: Cardiology

## 2018-07-29 DIAGNOSIS — G4733 Obstructive sleep apnea (adult) (pediatric): Secondary | ICD-10-CM

## 2018-07-29 HISTORY — DX: Morbid (severe) obesity due to excess calories: E66.01

## 2018-07-29 HISTORY — DX: Obstructive sleep apnea (adult) (pediatric): G47.33

## 2018-07-29 NOTE — Patient Instructions (Signed)
Medication Instructions:  Your physician recommends that you continue on your current medications as directed. Please refer to the Current Medication list given to you today.  If you need a refill on your cardiac medications before your next appointment, please call your pharmacy.   Lab work: None Ordered  If you have labs (blood work) drawn today and your tests are completely normal, you will receive your results only by: Marland Kitchen. MyChart Message (if you have MyChart) OR . A paper copy in the mail If you have any lab test that is abnormal or we need to change your treatment, we will call you to review the results.  Testing/Procedures: None ordered  Follow-Up: . You will need a follow up appointment in 1 year with Dr. Mayford Knifeurner for Sleep Apnea. You will receive a reminder letter in the mail two months in advance. If you don't receive a letter, please call our office to schedule the follow-up appointment.  Any Other Special Instructions Will Be Listed Below (If Applicable).

## 2018-07-29 NOTE — Progress Notes (Signed)
Cardiology Office Note:    Date:  07/29/2018   ID:  Adrienne [name], DOB [birth year], MRN [id number]  PCP:  Eartha InchBadger, Michael C, MD  Cardiologist:  Lesleigh NoeHenry W Smith III, MD    Referring MD: Eartha InchBadger, Michael C, MD   Chief Complaint  Patient presents with  . Sleep Apnea    History of Present Illness:    Adrienne [name] is a 47 y.o. female with a hx of ASCAD who was referred for sleep study and was found to have severe OSA with an AHI of 48/hr and no central sleep apnea.  Her O2 sats dropped to 77% with respiratory events.  She was started on auto CPAP and is here today for followup.  She is doing well with her CPAP device and thinks that she has gotten used to it.  She tolerates the mask and feels the pressure is adequate.  Since going on CPAP she feels rested in the am and has no significant daytime sleepiness.  She denies any significant mouth or nasal dryness or nasal congestion.  She does not think that he snores.     Past Medical History:  Diagnosis Date  . CAD (coronary artery disease) 03/18/2018   S/p NSTEMI 7/19: LHC - oLAD 50, mLCx 50, oRCA 90 >> PCI:  DES to [facility] // Echo 7/19:  Mild concentric LVH, EF 65-70, normal wall motion, normal diastolic function, trivial TR  . Coronary artery disease   . GERD (gastroesophageal reflux disease)   . Hyperlipidemia   . Morbid obesity (HCC) 07/29/2018  . OSA (obstructive sleep apnea) 07/29/2018   Severe OSA with an AHI of 48/hr and no central sleep apnea.  Her O2 sats dropped to 77% with respiratory events. She is now on auto CPAP    Past Surgical History:  Procedure Laterality Date  . CARDIAC CATHETERIZATION  02/25/2018  . CESAREAN SECTION    . CORONARY STENT INTERVENTION N/A 02/25/2018   Procedure: CORONARY STENT INTERVENTION;  Surgeon: Tonny Bollmanooper, Michael, MD;  [facility];  Service: Cardiovascular;  Laterality: N/A;  . finger surgery    . LEFT HEART CATH AND CORONARY ANGIOGRAPHY N/A 02/23/2018   Procedure: LEFT HEART CATH AND  CORONARY ANGIOGRAPHY;  Surgeon: Kathleene HazelMcAlhany, Christopher D, MD;  Location: [facility] INVASIVE CV LAB;  Service: Cardiovascular;  Laterality: N/A;    Current Medications: Current Meds  Medication Sig  . albuterol (PROAIR HFA) 108 (90 Base) MCG/ACT inhaler Inhale 1-2 puffs into the lungs every 6 (six) hours as needed for wheezing or shortness of breath.  . ALPRAZolam (XANAX) 1 MG tablet Take 0.5-1 mg by mouth 3 (three) times daily as needed for anxiety.   Marland Kitchen. aspirin 81 MG tablet Take 81 mg by mouth daily.  . baclofen (LIORESAL) 10 MG tablet Take 10 mg by mouth as needed for muscle spasms.  Marland Kitchen. escitalopram (LEXAPRO) 20 MG tablet Take 20 mg by mouth daily.  Marland Kitchen. esomeprazole (NEXIUM) 40 MG capsule Take 1 capsule by mouth daily.  . fluticasone (FLONASE) 50 MCG/ACT nasal spray Place 1 spray into both nostrils daily.   Marland Kitchen. HYDROcodone-acetaminophen (NORCO/VICODIN) 5-325 MG tablet Take 1 tablet by mouth every 6 (six) hours as needed for moderate pain or severe pain.  Marland Kitchen. ibuprofen (ADVIL,MOTRIN) 200 MG tablet Take 600-800 mg by mouth every 6 (six) hours as needed (for pain, headaches, or cramps).  . metoprolol tartrate (LOPRESSOR) 25 MG tablet Take 25 mg by mouth 2 (two) times daily.  . nitroGLYCERIN (NITROSTAT) 0.4 MG  SL tablet Place 1 tablet (0.4 mg total) under the tongue every 5 (five) minutes as needed for chest pain.  Marland Kitchen omeprazole (PRILOSEC) 40 MG capsule Take 40 mg by mouth daily.  . ranitidine (ZANTAC) 150 MG capsule Take 150 mg by mouth as needed for heartburn.  . simvastatin (ZOCOR) 40 MG tablet Take 40 mg by mouth at bedtime.      Allergies:   Latex; Ciprofloxacin; and Amoxicillin   Social History   Socioeconomic History  . Marital status: Married    Spouse name: Not on file  . Number of children: Not on file  . Years of education: Not on file  . Highest education level: Not on file  Occupational History  . Not on file  Social Needs  . Financial resource strain: Not on file  . Food insecurity:     Worry: Not on file    Inability: Not on file  . Transportation needs:    Medical: Not on file    Non-medical: Not on file  Tobacco Use  . Smoking status: Former Smoker    Types: Cigarettes    Last attempt to quit: 02/22/2018    Years since quitting: 0.4  . Smokeless tobacco: Never Used  Substance and Sexual Activity  . Alcohol use: No  . Drug use: No  . Sexual activity: Not on file  Lifestyle  . Physical activity:    Days per week: Not on file    Minutes per session: Not on file  . Stress: Not on file  Relationships  . Social connections:    Talks on phone: Not on file    Gets together: Not on file    Attends religious service: Not on file    Active member of club or organization: Not on file    Attends meetings of clubs or organizations: Not on file    Relationship status: Not on file  Other Topics Concern  . Not on file  Social History Narrative  . Not on file     Family History: The patient's family history includes CAD in her mother.  ROS:   Please see the history of present illness.    ROS  All other systems reviewed and negative.   EKGs/Labs/Other Studies Reviewed:    The following studies were reviewed today: PAP download  EKG:  EKG is not ordered today.  Recent Labs: 02/26/2018: BUN 10; Creatinine, Ser 0.65; Hemoglobin 13.4; Platelets 233; Potassium 3.9; Sodium 140 06/14/2018: ALT 17   Recent Lipid Panel    Component Value Date/Time   CHOL 171 06/14/2018 1418   TRIG 102 06/14/2018 1418   HDL 62 06/14/2018 1418   CHOLHDL 2.8 06/14/2018 1418   CHOLHDL 3.4 02/26/2018 0417   VLDL 28 02/26/2018 0417   LDLCALC 89 06/14/2018 1418    Physical Exam:    VS:  BP 132/70   Pulse (!) 58   Ht 5\' 4"  (1.626 m)   Wt 249 lb 6.4 oz (113.1 kg)   SpO2 99%   BMI 42.81 kg/m     Wt Readings from Last 3 Encounters:  07/29/18 249 lb 6.4 oz (113.1 kg)  06/20/18 251 lb 12.8 oz (114.2 kg)  03/18/18 240 lb 1.9 oz (108.9 kg)     GEN:  Well nourished, well  developed in no acute distress HEENT: Normal NECK: No JVD; No carotid bruits LYMPHATICS: No lymphadenopathy CARDIAC: RRR, no murmurs, rubs, gallops RESPIRATORY:  Clear to auscultation without rales, wheezing or rhonchi  ABDOMEN: Soft,  non-tender, non-distended MUSCULOSKELETAL:  No edema; No deformity  SKIN: Warm and dry NEUROLOGIC:  Alert and oriented x 3 PSYCHIATRIC:  Normal affect   ASSESSMENT:    1. OSA (obstructive sleep apnea)   2. Morbid obesity (HCC)    PLAN:    In order of problems listed above:  1.  OSA - the patient is tolerating PAP therapy well without any problems. The PAP download was reviewed today and showed an AHI of 5.3/hr on auto PAP  with 53% compliance in using more than 4 hours nightly.  The patient has been using and benefiting from PAP use and will continue to benefit from therapy. I encouraged her to be more compliant with her device.   2.  Morbid Obesity - I have encouraged her to get into a routine exercise program and cut back on carbs and portions.      Medication Adjustments/Labs and Tests Ordered: Current medicines are reviewed at length with the patient today.  Concerns regarding medicines are outlined above.  No orders of the defined types were placed in this encounter.  No orders of the defined types were placed in this encounter.   Signed, Armanda Magic, MD  07/29/2018 10:48 AM    [facility]

## 2018-08-25 ENCOUNTER — Telehealth: Payer: Self-pay | Admitting: Interventional Cardiology

## 2018-08-25 NOTE — Telephone Encounter (Signed)
New Message    Eileen Stanford from [facility] Dr. Dimas Millin nurse calling to get clarity on the surgical clearance, and the patient scheduled for surgery Monday 08/29/18

## 2018-08-25 NOTE — Telephone Encounter (Signed)
Spoke with Eileen Stanford at Dr. Vella Raring office.  She needed clarification on whether or not pt can have surgery.  She states pt is just having a lesion removed from her eyelid.  She states they can do this without stopping DAPT.  She states they would do this in the office and it has very minimal bleeding so they are able to do while still on DAPT.  Asked her to fax over a new clearance and I would take this to Dr. Katrinka Blazing for review.  Eileen Stanford was in agreement with this plan.

## 2018-12-09 ENCOUNTER — Telehealth: Payer: Self-pay

## 2018-12-09 NOTE — Telephone Encounter (Signed)
**Note De-Identified Adrienne [name] Obfuscation** I have done a Brilinta PA through covermymeds. Key: [id number]

## 2018-12-12 NOTE — Telephone Encounter (Signed)
The following message was received from covermymeds:  Adrienne [name] (Key: [id number])   This request has received a Cancelled outcome. This may mean either your patient does not have active coverage with this plan, this authorization was processed as a duplicate request, or an authorization was not needed for this medication. Note any additional information provided by [employer] [facility] [place] at the bottom of this request, and contact [employer]  directly for further details.   I called [facility] and was advised that a temp was helping at their pharmacy and must have requested this Brilinta PA. They confirmed that a Brilinta PA is not required at this time.

## 2018-12-13 NOTE — Telephone Encounter (Signed)
**Note De-Identified Adrienne [name] Obfuscation** Letter received Adrienne [name] fax from [facility] confirming that a PA for the pts Brilinta is not required and that she picked up her Brilinta on 12/12/2018.

## 2018-12-14 ENCOUNTER — Telehealth: Payer: Self-pay | Admitting: Interventional Cardiology

## 2018-12-14 NOTE — Telephone Encounter (Signed)
Patient set up for MyChart?  YES - CONSENT SENT  Is patient using Smartphone/computer/tablet? IPHONE  Did audio/video work?  Does patient need telephone visit NO  Best phone number to use? [phone]  Special Instructions? PATIENTS DOES NOT HAVE BP MACHINE      Virtual Visit Pre-Appointment Phone Call  "(Name), I am calling you today to discuss your upcoming appointment. We are currently trying to limit exposure to the virus that causes COVID-19 by seeing patients at home rather than in the office."  1. "What is the BEST phone number to call the day of the visit?" - include this in appointment notes  2. Do you have or have access to (through a family member/friend) a smartphone with video capability that we can use for your visit?" a. If yes - list this number in appt notes as cell (if different from BEST phone #) and list the appointment type as a VIDEO visit in appointment notes b. If no - list the appointment type as a PHONE visit in appointment notes  3. Confirm consent - "In the setting of the current Covid19 crisis, you are scheduled for a (phone or video) visit with your provider on (date) at (time).  Just as we do with many in-office visits, in order for you to participate in this visit, we must obtain consent.  If you'd like, I can send this to your mychart (if signed up) or email for you to review.  Otherwise, I can obtain your verbal consent now.  All virtual visits are billed to your insurance company just like a normal visit would be.  By agreeing to a virtual visit, we'd like you to understand that the technology does not allow for your provider to perform an examination, and thus may limit your provider's ability to fully assess your condition. If your provider identifies any concerns that need to be evaluated in person, we will make arrangements to do so.  Finally, though the technology is pretty good, we cannot assure that it will always work on either your or our end,  and in the setting of a video visit, we may have to convert it to a phone-only visit.  In either situation, we cannot ensure that we have a secure connection.  Are you willing to proceed?" STAFF: Did the patient verbally acknowledge consent to telehealth visit? Document YES/NO here: YES   4. Advise patient to be prepared - "Two hours prior to your appointment, go ahead and check your blood pressure, pulse, oxygen saturation, and your weight (if you have the equipment to check those) and write them all down. When your visit starts, your provider will ask you for this information. If you have an Apple Watch or Kardia device, please plan to have heart rate information ready on the day of your appointment. Please have a pen and paper handy nearby the day of the visit as well."  5. Give patient instructions for MyChart download to smartphone OR Doximity/Doxy.me as below if video visit (depending on what platform provider is using)  6. Inform patient they will receive a phone call 15 minutes prior to their appointment time (may be from unknown caller ID) so they should be prepared to answer    TELEPHONE CALL NOTE  Adrienne [name] has been deemed a candidate for a follow-up tele-health visit to limit community exposure during the Covid-19 pandemic. I spoke with the patient via phone to ensure availability of phone/video source, confirm preferred email & phone number, and  discuss instructions and expectations.  I reminded Adrienne [name] to be prepared with any vital sign and/or heart rhythm information that could potentially be obtained via home monitoring, at the time of her visit. I reminded Adrienne [name] to expect a phone call prior to her visit.  Adrienne [name] 12/14/2018 4:29 PM   INSTRUCTIONS FOR DOWNLOADING THE MYCHART APP TO SMARTPHONE  - The patient must first make sure to have activated MyChart and know their login information - If Apple, go to [employer] Store and type in MyChart in the  search bar and download the app. If Android, ask patient to go to [employer] Play Store and type in [facility]Chart in the search bar and download the app. The app is free but as with any other app downloads, their phone may require them to verify saved payment information or Apple/Android password.  - The patient will need to then log into the app with their MyChart username and password, and select [facility] as their healthcare provider to link the account. When it is time for your visit, go to the MyChart app, find appointments, and click Begin Video Visit. Be sure to Select Allow for your device to access the Microphone and Camera for your visit. You will then be connected, and your provider will be with you shortly.  **If they have any issues connecting, or need assistance please contact MyChart service desk [phone])**  **If using a computer, in order to ensure the best quality for their visit they will need to use either of the following Internet Browsers: [employer] Edge, or Google Chrome**  IF USING DOXIMITY or DOXY.ME - The patient will receive a link just prior to their visit by text.     FULL LENGTH CONSENT FOR TELE-HEALTH VISIT   I hereby voluntarily request, consent and authorize [facility] and its employed or contracted physicians, physician assistants, nurse practitioners or other licensed health care professionals (the Practitioner), to provide me with telemedicine health care services (the Services") as deemed necessary by the treating Practitioner. I acknowledge and consent to receive the Services by the Practitioner via telemedicine. I understand that the telemedicine visit will involve communicating with the Practitioner through live audiovisual communication technology and the disclosure of certain medical information by electronic transmission. I acknowledge that I have been given the opportunity to request an in-person assessment or other available alternative prior  to the telemedicine visit and am voluntarily participating in the telemedicine visit.  I understand that I have the right to withhold or withdraw my consent to the use of telemedicine in the course of my care at any time, without affecting my right to future care or treatment, and that the Practitioner or I may terminate the telemedicine visit at any time. I understand that I have the right to inspect all information obtained and/or recorded in the course of the telemedicine visit and may receive copies of available information for a reasonable fee.  I understand that some of the potential risks of receiving the Services via telemedicine include:   Delay or interruption in medical evaluation due to technological equipment failure or disruption;  Information transmitted may not be sufficient (e.g. poor resolution of images) to allow for appropriate medical decision making by the Practitioner; and/or   In rare instances, security protocols could fail, causing a breach of personal health information.  Furthermore, I acknowledge that it is my responsibility to provide information about my medical history, conditions and care that is complete and  accurate to the best of my ability. I acknowledge that Practitioner's advice, recommendations, and/or decision may be based on factors not within their control, such as incomplete or inaccurate data provided by me or distortions of diagnostic images or specimens that may result from electronic transmissions. I understand that the practice of medicine is not an exact science and that Practitioner makes no warranties or guarantees regarding treatment outcomes. I acknowledge that I will receive a copy of this consent concurrently upon execution via email to the email address I last provided but may also request a printed copy by calling the office of [facility].    I understand that my insurance will be billed for this visit.   I have read or had this consent read  to me.  I understand the contents of this consent, which adequately explains the benefits and risks of the Services being provided via telemedicine.   I have been provided ample opportunity to ask questions regarding this consent and the Services and have had my questions answered to my satisfaction.  I give my informed consent for the services to be provided through the use of telemedicine in my medical care  By participating in this telemedicine visit I agree to the above.

## 2018-12-19 NOTE — Progress Notes (Signed)
Virtual Visit via Video Note   This visit type was conducted due to national recommendations for restrictions regarding the COVID-19 Pandemic (e.g. social distancing) in an effort to limit this patient's exposure and mitigate transmission in our community.  Due to her co-morbid illnesses, this patient is at least at moderate risk for complications without adequate follow up.  This format is felt to be most appropriate for this patient at this time.  All issues noted in this document were discussed and addressed.  A limited physical exam was performed with this format.  Please refer to the patient's chart for her consent to telehealth for [facility].   Evaluation Performed:  Follow-up visit  Date:  12/19/2018   ID:  Adrienne [name], DOB 1971/06/19, MRN [id number]  Patient Location: Home Provider Location: Office  PCP:  Adrienne [name], Adrienne C, MD  Cardiologist:  Adrienne NoeHenry W Smith III, MD  Electrophysiologist:  None   Chief Complaint:  CAD  History of Present Illness:    Adrienne [name] is a 48 y.o. female with hx of non STEMI, CAD with ostial RCA stent 02/2018, and hyperlipidemia.  She had one episode of chest pressure that went away with burping approximately 3 months ago.  Nothing else since that time.  Compliant with medication regimen.  Not very physically active due to plantar fasciitis and degenerative disc lumbar disease.  Has dyspnea with Brilinta but does not want to change.  Looking forward to discontinuation in July.  No tobacco use since her MI.  The patient does not have symptoms concerning for COVID-19 infection (fever, chills, cough, or new shortness of breath).    Past Medical History:  Diagnosis Date  . CAD (coronary artery disease) 03/18/2018   S/p NSTEMI 7/19: LHC - oLAD 50, mLCx 50, oRCA 90 >> PCI[facility]  [facility] 7/[facility]:  Mild concentric LVH, EF 65-70, normal wall motion, normal diastolic function, trivial TR  . Coronary artery disease   . GERD  (gastroesophageal reflux disease)   . Hyperlipidemia   . Morbid obesity (HCC) 07/29/2018  . OSA (obstructive sleep apnea) 07/29/2018   Severe OSA with an AHI of 48/hr and no central sleep apnea.  Her O2 sats dropped to 77% with respiratory events. She is now on auto CPAP   Past Surgical History:  Procedure Laterality Date  . CARDIAC CATHETERIZATION  02/25/2018  . CESAREAN SECTION    . CORONARY STENT INTERVENTION N/A 02/25/2018   Procedure: CORONARY STENT INTERVENTION;  Surgeon: Tonny Bollmanooper, Michael, MD;  [facility];  Service: Cardiovascular;  Laterality: N/A;  . finger surgery    . LEFT HEART CATH AND CORONARY ANGIOGRAPHY N/A 02/23/2018   Procedure: LEFT HEART CATH AND CORONARY ANGIOGRAPHY;  Surgeon: Kathleene HazelMcAlhany, Christopher D, MD;  Location: [facility] INVASIVE CV LAB;  Service: Cardiovascular;  Laterality: N/A;     No outpatient medications have been marked as taking for the 12/20/18 encounter (Appointment) with Lyn RecordsSmith, Henry W, MD.     Allergies:   Latex; Ciprofloxacin; and Amoxicillin   Social History   Tobacco Use  . Smoking status: Former Smoker    Types: Cigarettes    Last attempt to quit: 02/22/2018    Years since quitting: 0.8  . Smokeless tobacco: Never Used  Substance Use Topics  . Alcohol use: No  . Drug use: No     Family Hx: The patient's family history includes CAD in her mother.  ROS:   Please see the history of present illness.  Trying to get used to CPAP.  She is compliant.  Never had plastic surgery. All other systems reviewed and are negative.   Prior CV studies:   The following studies were reviewed today:  No new data  Labs/Other Tests and Data Reviewed:    EKG:  No ECG reviewed.  Recent Labs: 02/26/2018: BUN 10; Creatinine, Ser 0.65; Hemoglobin 13.4; Platelets 233; Potassium 3.9; Sodium 140 06/14/2018: ALT 17   Recent Lipid Panel Lab Results  Component Value Date/Time   CHOL 171 06/14/2018 02:18 PM   TRIG 102 06/14/2018 02:18 PM   HDL 62  06/14/2018 02:18 PM   CHOLHDL 2.8 06/14/2018 02:18 PM   CHOLHDL 3.4 02/26/2018 04:17 AM   LDLCALC 89 06/14/2018 02:18 PM    Wt Readings from Last 3 Encounters:  07/29/18 249 lb 6.4 oz (113.1 kg)  06/20/18 251 lb 12.8 oz (114.2 kg)  03/18/18 240 lb 1.9 oz (108.9 kg)     Objective:    Vital Signs:  There were no vitals taken for this visit.   VITAL SIGNS:  reviewed GEN:  Significant obesity RESPIRATORY:  normal respiratory effort, symmetric expansion NEURO:  alert and oriented x 3, no obvious focal deficit PSYCH:  Anxious and loquacious.  ASSESSMENT & PLAN:    1. Coronary artery disease involving native coronary artery of native heart without angina pectoris   2. Mixed hyperlipidemia   3. Morbid obesity (HCC)   4. Tobacco abuse    PLAN:  1. Overall doing well.  Not observing the aerobic activity metric due to multiple orthopedic issues.  Weight is still an issue.  Secondary prevention discussed again in detail. 2. LDL target less than 70.  Lipid panel will be performed in July. 3. Decrease caloric intake, moderate aerobic activity, discussed in detail. 4. She is congratulated on continued smoking cessation. 5. She is compliant with CPAP.  Overall education and awareness concerning primary/secondary risk prevention was discussed in detail: LDL less than 70, hemoglobin A1c less than 7, blood pressure target less than 130/80 mmHg, >150 minutes of moderate aerobic activity per week, avoidance of smoking, weight control (via diet and exercise), and continued surveillance/management of/for obstructive sleep apnea.   COVID-19 Education: The signs and symptoms of COVID-19 were discussed with the patient and how to seek care for testing (follow up with PCP or arrange E-visit).  The importance of social distancing was discussed today.  Time:   Today, I have spent 20 minutes with the patient with telehealth technology discussing the above problems.     Medication Adjustments/Labs  and Tests Ordered: Current medicines are reviewed at length with the patient today.  Concerns regarding medicines are outlined above.   Tests Ordered: No orders of the defined types were placed in this encounter.   Medication Changes: No orders of the defined types were placed in this encounter.   Disposition:  Follow up 2 months  Signed, Adrienne Noe, MD  12/19/2018 6:30 PM    [facility]

## 2018-12-20 ENCOUNTER — Encounter: Payer: Self-pay | Admitting: Interventional Cardiology

## 2018-12-20 ENCOUNTER — Other Ambulatory Visit: Payer: Self-pay

## 2018-12-20 ENCOUNTER — Telehealth (INDEPENDENT_AMBULATORY_CARE_PROVIDER_SITE_OTHER): Payer: BLUE CROSS/BLUE SHIELD | Admitting: Interventional Cardiology

## 2018-12-20 VITALS — Ht 64.0 in | Wt 249.0 lb

## 2018-12-20 DIAGNOSIS — Z720 Tobacco use: Secondary | ICD-10-CM

## 2018-12-20 DIAGNOSIS — E782 Mixed hyperlipidemia: Secondary | ICD-10-CM | POA: Diagnosis not present

## 2018-12-20 DIAGNOSIS — I2510 Atherosclerotic heart disease of native coronary artery without angina pectoris: Secondary | ICD-10-CM

## 2018-12-20 NOTE — Addendum Note (Signed)
Addended by: Gunnar Fusi A on: 12/20/2018 05:06 PM   Modules accepted: Orders

## 2018-12-20 NOTE — Patient Instructions (Signed)
Medication Instructions:  1) You may STOP BRILINTA in July  Labwork: Your provider recommends that you return for FASTING lab work in July    Follow-Up: Victorino Dike, Dr. Michaelle Copas nurse, will contact you to arrange follow-up in July.

## 2018-12-28 ENCOUNTER — Telehealth: Payer: Self-pay | Admitting: Interventional Cardiology

## 2018-12-28 NOTE — Telephone Encounter (Signed)
New message:    Patient states that Dr. Katrinka Blazing would like to see her in July. Please call patient.

## 2018-12-28 NOTE — Telephone Encounter (Signed)
Spoke with pt and scheduled her for labs and to see Dr. Katrinka Blazing in July.

## 2019-01-20 ENCOUNTER — Other Ambulatory Visit: Payer: Self-pay | Admitting: Nurse Practitioner

## 2019-01-20 DIAGNOSIS — Z1231 Encounter for screening mammogram for malignant neoplasm of breast: Secondary | ICD-10-CM

## 2019-02-15 ENCOUNTER — Other Ambulatory Visit: Payer: BLUE CROSS/BLUE SHIELD

## 2019-02-16 ENCOUNTER — Ambulatory Visit: Payer: BLUE CROSS/BLUE SHIELD

## 2019-02-20 ENCOUNTER — Ambulatory Visit: Payer: BC Managed Care – PPO

## 2019-02-28 ENCOUNTER — Other Ambulatory Visit: Payer: BLUE CROSS/BLUE SHIELD

## 2019-03-01 ENCOUNTER — Other Ambulatory Visit: Payer: BC Managed Care – PPO

## 2019-03-01 ENCOUNTER — Other Ambulatory Visit: Payer: Self-pay

## 2019-03-01 DIAGNOSIS — I2510 Atherosclerotic heart disease of native coronary artery without angina pectoris: Secondary | ICD-10-CM

## 2019-03-02 ENCOUNTER — Other Ambulatory Visit: Payer: Self-pay | Admitting: Physician Assistant

## 2019-03-02 LAB — LIPID PANEL
Chol/HDL Ratio: 2.7 ratio (ref 0.0–4.4)
Cholesterol, Total: 178 mg/dL (ref 100–199)
HDL: 67 mg/dL (ref >39)
LDL Calculated: 85 mg/dL (ref 0–99)
Triglycerides: 128 mg/dL (ref 0–149)
VLDL Cholesterol Cal: 26 mg/dL (ref 5–40)

## 2019-03-03 ENCOUNTER — Telehealth: Payer: Self-pay | Admitting: *Deleted

## 2019-03-03 NOTE — Telephone Encounter (Signed)
Pt has been notified of lab results by phone with verbal understanding. Pt thanked me for the call. Pt has upcoming appt 7/24 with Dr. Tamala Julian. I will send a copy of results to PCP Dr. Anastasia Pall.

## 2019-03-08 ENCOUNTER — Other Ambulatory Visit: Payer: Self-pay | Admitting: Physician Assistant

## 2019-03-15 ENCOUNTER — Other Ambulatory Visit: Payer: Self-pay | Admitting: Interventional Cardiology

## 2019-03-17 ENCOUNTER — Telehealth: Payer: Self-pay | Admitting: *Deleted

## 2019-03-17 NOTE — Telephone Encounter (Signed)
Called pt for covid19 prescreen for upcoming appt 03/20/2019, pt has answered NO to all following questions:       COVID-19 Pre-Screening Questions:  . In the past 7 to 10 days have you had a cough,  shortness of breath, headache, congestion, fever (100 or greater) body aches, chills, sore throat, or sudden loss of taste or sense of smell? . Have you been around anyone with known Covid 19. . Have you been around anyone who is awaiting Covid 19 test results in the past 7 to 10 days? . Have you been around anyone who has been exposed to Covid 19, or has mentioned symptoms of Covid 19 within the past 7 to 10 days?  If you have any concerns/questions about symptoms patients report during screening (either on the phone or at threshold). Contact the provider seeing the patient or DOD for further guidance.  If neither are available contact a member of the leadership team.

## 2019-03-18 NOTE — Progress Notes (Signed)
Cardiology Office Note:    Date:  03/20/2019   ID:  Adrienne KrasHeather L Morace, DOB Jan 04, 1971, MRN [id number]  PCP:  Eartha InchBadger, Michael C, MD  Cardiologist:  Lesleigh NoeHenry W Makyle Eslick III, MD   Referring MD: Eartha InchBadger, Michael C, MD   Chief Complaint  Patient presents with   Coronary Artery Disease   Hyperlipidemia    History of Present Illness:    Adrienne [name] is a 48 y.o. female with a hx of OSA, non-STEMI, CAD with ostial RCA stent 02/2018, and hyperlipidemia.  She is doing relatively well.  She is limited because of chronic back pain.  She denies angina.  Brilinta has caused hypercapnia.  She is not exercising regularly.  She has fear of medications.  Lipids are not optimal.  She has not had recurrence of angina.  Past Medical History:  Diagnosis Date   CAD (coronary artery disease) 03/18/2018   S/p NSTEMI 7/19: LHC - oLAD 50, mLCx 50, oRCA 90 >> [facility]  [facility] Echo 7/19:  Mild concentric LVH, EF 65-70, normal wall motion, normal diastolic function, trivial TR   Coronary artery disease    GERD (gastroesophageal reflux disease)    Hyperlipidemia    Morbid obesity (HCC) 07/29/2018   OSA (obstructive sleep apnea) 07/29/2018   Severe OSA with an AHI of 48/hr and no central sleep apnea.  Her O2 sats dropped to 77% with respiratory events. She is now on auto CPAP    Past Surgical History:  Procedure Laterality Date   CARDIAC CATHETERIZATION  02/25/2018   CESAREAN SECTION     CORONARY STENT INTERVENTION N/A 02/25/2018   Procedure: CORONARY STENT INTERVENTION;  Surgeon: Tonny Bollmanooper, Michael, MD;  [facility];  Service: Cardiovascular;  Laterality: N/A;   finger surgery     LEFT HEART CATH AND CORONARY ANGIOGRAPHY N/A 02/23/2018   Procedure: LEFT HEART CATH AND CORONARY ANGIOGRAPHY;  Surgeon: Kathleene HazelMcAlhany, Christopher D, MD;  Location: [facility] INVASIVE CV LAB;  Service: Cardiovascular;  Laterality: N/A;    Current Medications: Current Meds  Medication Sig   albuterol (PROAIR HFA)  108 (90 Base) MCG/ACT inhaler Inhale 1-2 puffs into the lungs every 6 (six) hours as needed for wheezing or shortness of breath.   ALPRAZolam (XANAX) 1 MG tablet Take 0.5-1 mg by mouth 3 (three) times daily as needed for anxiety.    aspirin 81 MG tablet Take 81 mg by mouth daily.   baclofen (LIORESAL) 10 MG tablet Take 10 mg by mouth as needed for muscle spasms.   escitalopram (LEXAPRO) 20 MG tablet Take 10 mg by mouth daily.    fluticasone (FLONASE) 50 MCG/ACT nasal spray Place 1 spray into both nostrils as needed for allergies.    ibuprofen (ADVIL,MOTRIN) 200 MG tablet Take 600-800 mg by mouth every 6 (six) hours as needed (for pain, headaches, or cramps).   metoprolol tartrate (LOPRESSOR) 25 MG tablet Take 1 tablet by mouth twice daily   Multiple Vitamin (MULTIVITAMIN) tablet Take 1 tablet by mouth daily.   nitroGLYCERIN (NITROSTAT) 0.4 MG SL tablet PLACE 1 TABLET UNDER THE TONGUE EVERY 5 MINUTES AS NEEDED FOR CHEST PAIN   omeprazole (PRILOSEC) 40 MG capsule Take 40 mg by mouth daily.   ranitidine (ZANTAC) 150 MG capsule Take 150 mg by mouth as needed for heartburn.   [DISCONTINUED] BRILINTA 90 MG TABS tablet Take 1 tablet by mouth 2 (two) times a day.   [DISCONTINUED] simvastatin (ZOCOR) 40 MG tablet Take 40 mg by mouth at  bedtime.      Allergies:   Latex, Ciprofloxacin, and Amoxicillin   Social History   Socioeconomic History   Marital status: Married    Spouse name: Not on file   Number of children: Not on file   Years of education: Not on file   Highest education level: Not on file  Occupational History   Not on file  Social Needs   Financial resource strain: Not on file   Food insecurity    Worry: Not on file    Inability: Not on file   Transportation needs    Medical: Not on file    Non-medical: Not on file  Tobacco Use   Smoking status: Former Smoker    Types: Cigarettes    Quit date: 02/22/2018    Years since quitting: 1.0   Smokeless tobacco:  Never Used  Substance and Sexual Activity   Alcohol use: No   Drug use: No   Sexual activity: Not on file  Lifestyle   Physical activity    Days per week: Not on file    Minutes per session: Not on file   Stress: Not on file  Relationships   Social connections    Talks on phone: Not on file    Gets together: Not on file    Attends religious service: Not on file    Active member of club or organization: Not on file    Attends meetings of clubs or organizations: Not on file    Relationship status: Not on file  Other Topics Concern   Not on file  Social History Narrative   Not on file     Family History: The patient's family history includes CAD in her mother.  ROS:   Please see the history of present illness.    Significant low back discomfort.  Some days she has extreme oppressive fatigue.  All other systems reviewed and are negative.  EKGs/Labs/Other Studies Reviewed:    The following studies were reviewed today: No new data  EKG:  EKG normal sinus rhythm, flattened T waves, otherwise normal.  Recent Labs: 06/14/2018: ALT 17  Recent Lipid Panel    Component Value Date/Time   CHOL 178 03/01/2019 1455   TRIG 128 03/01/2019 1455   HDL 67 03/01/2019 1455   CHOLHDL 2.7 03/01/2019 1455   CHOLHDL 3.4 02/26/2018 0417   VLDL 28 02/26/2018 0417   LDLCALC 85 03/01/2019 1455    Physical Exam:    VS:  BP 128/78    Pulse 64    Ht 5\' 4"  (1.626 m)    Wt 264 lb 8 oz (120 kg)    SpO2 96%    BMI 45.40 kg/m     Wt Readings from Last 3 Encounters:  03/20/19 264 lb 8 oz (120 kg)  12/20/18 249 lb (112.9 kg)  07/29/18 249 lb 6.4 oz (113.1 kg)     GEN: Obese and smells of cigarette smoke.. No acute distress HEENT: Normal NECK: No JVD. LYMPHATICS: No lymphadenopathy CARDIAC:  RRR without murmur, gallop, or edema. VASCULAR:  Normal Pulses. No bruits. RESPIRATORY:  Clear to auscultation without rales, wheezing or rhonchi  ABDOMEN: Soft, non-tender, non-distended, No  pulsatile mass, MUSCULOSKELETAL: No deformity  SKIN: Warm and dry NEUROLOGIC:  Alert and oriented x 3 PSYCHIATRIC:  Normal affect   ASSESSMENT:    1. NSTEMI (non-ST elevated myocardial infarction) ([place])   2. Coronary artery disease involving native coronary artery of native heart without angina pectoris   3.  Mixed hyperlipidemia   4. Morbid obesity (HCC)   5. Tobacco abuse   6. OSA (obstructive sleep apnea)   7. Educated About Covid-19 Virus Infection    PLAN:    In order of problems listed above:  1. Received RCA stent last July. 2. Secondary prevention discussed.  Lipids are not optimally treated, still smoking, not exercising, overweight, and not sleeping well.  This places her at high risk for recurrent events.  We discussed this in detail. 3. Most recent LDL was 85.  Switch from simvastatin 40 to rosuvastatin 40 mg/day.  Liver and lipid panel in 6 weeks. 4. Decrease caloric intake. 5. Encouraged tobacco cessation 6. Encouraged CPAP compliance 7. Social distancing, washing, and mask wearing.  Overall education and awareness concerning primary/secondary risk prevention was discussed in detail: LDL less than 70, hemoglobin A1c less than 7, blood pressure target less than 130/80 mmHg, >150 minutes of moderate aerobic activity per week, avoidance of smoking, weight control (via diet and exercise), and continued surveillance/management of/for obstructive sleep apnea.    Medication Adjustments/Labs and Tests Ordered: Current medicines are reviewed at length with the patient today.  Concerns regarding medicines are outlined above.  No orders of the defined types were placed in this encounter.  No orders of the defined types were placed in this encounter.   There are no Patient Instructions on file for this visit.   Signed, Lesleigh NoeHenry W Avalina Benko III, MD  03/20/2019 5:24 PM    [facility]

## 2019-03-20 ENCOUNTER — Other Ambulatory Visit: Payer: Self-pay

## 2019-03-20 ENCOUNTER — Encounter: Payer: Self-pay | Admitting: Interventional Cardiology

## 2019-03-20 ENCOUNTER — Ambulatory Visit (INDEPENDENT_AMBULATORY_CARE_PROVIDER_SITE_OTHER): Payer: BC Managed Care – PPO | Admitting: Interventional Cardiology

## 2019-03-20 VITALS — BP 128/78 | HR 64 | Ht 64.0 in | Wt 264.5 lb

## 2019-03-20 DIAGNOSIS — E782 Mixed hyperlipidemia: Secondary | ICD-10-CM | POA: Diagnosis not present

## 2019-03-20 DIAGNOSIS — I2510 Atherosclerotic heart disease of native coronary artery without angina pectoris: Secondary | ICD-10-CM | POA: Diagnosis not present

## 2019-03-20 DIAGNOSIS — I214 Non-ST elevation (NSTEMI) myocardial infarction: Secondary | ICD-10-CM

## 2019-03-20 DIAGNOSIS — G4733 Obstructive sleep apnea (adult) (pediatric): Secondary | ICD-10-CM

## 2019-03-20 DIAGNOSIS — Z720 Tobacco use: Secondary | ICD-10-CM

## 2019-03-20 DIAGNOSIS — Z7189 Other specified counseling: Secondary | ICD-10-CM

## 2019-03-20 MED ORDER — ROSUVASTATIN CALCIUM 40 MG PO TABS: 40.0000 mg | ORAL_TABLET | Freq: Every day | ORAL | 3 refills | Status: DC

## 2019-03-20 NOTE — Patient Instructions (Signed)
Medication Instructions:  1) DISCONTINUE Simvastatin 2) DISCONTINUE Brilinta 3) START Rosuvastatin 40mg  once daily  If you need a refill on your cardiac medications before your next appointment, please call your pharmacy.   Lab work: Your physician recommends that you return for lab work in: 6 weeks (BMET, Liver, Lipid).  You will need to be fasting for these labs (nothing to eat or drink after midnight except water and black coffee).  If you have labs (blood work) drawn today and your tests are completely normal, you will receive your results only by: Marland Kitchen MyChart Message (if you have MyChart) OR . A paper copy in the mail If you have any lab test that is abnormal or we need to change your treatment, we will call you to review the results.  Testing/Procedures: None  Follow-Up: At [facility], you and your health needs are our priority.  As part of our continuing mission to provide you with exceptional heart care, we have created designated Provider Care Teams.  These Care Teams include your primary Cardiologist (physician) and Advanced Practice Providers (APPs -  Physician Assistants and Nurse Practitioners) who all work together to provide you with the care you need, when you need it. You will need a follow up appointment in 12 months.  Please call our office 2 months in advance to schedule this appointment.  You may see Sinclair Grooms, MD or one of the following Advanced Practice Providers on your designated Care Team:   Truitt Merle, NP Cecilie Kicks, NP . Kathyrn Drown, NP  Any Other Special Instructions Will Be Listed Below (If Applicable).

## 2019-03-29 ENCOUNTER — Ambulatory Visit
Admission: RE | Admit: 2019-03-29 | Discharge: 2019-03-29 | Disposition: A | Discharge status: Home / Self Care | Payer: BC Managed Care – PPO | Source: Ambulatory Visit | Attending: Nurse Practitioner | Admitting: Nurse Practitioner

## 2019-03-29 ENCOUNTER — Other Ambulatory Visit: Payer: Self-pay

## 2019-03-29 DIAGNOSIS — Z1231 Encounter for screening mammogram for malignant neoplasm of breast: Secondary | ICD-10-CM

## 2019-05-04 ENCOUNTER — Other Ambulatory Visit: Payer: Self-pay

## 2019-05-04 ENCOUNTER — Other Ambulatory Visit: Payer: BC Managed Care – PPO | Admitting: *Deleted

## 2019-05-04 DIAGNOSIS — I2510 Atherosclerotic heart disease of native coronary artery without angina pectoris: Secondary | ICD-10-CM

## 2019-05-04 DIAGNOSIS — I214 Non-ST elevation (NSTEMI) myocardial infarction: Secondary | ICD-10-CM

## 2019-05-04 DIAGNOSIS — E782 Mixed hyperlipidemia: Secondary | ICD-10-CM

## 2019-05-05 LAB — HEPATIC FUNCTION PANEL
ALT: 23 IU/L (ref 0–32)
AST: 21 IU/L (ref 0–40)
Albumin: 4.1 g/dL (ref 3.8–4.8)
Alkaline Phosphatase: 101 IU/L (ref 39–117)
Bilirubin Total: 0.4 mg/dL (ref 0.0–1.2)
Bilirubin, Direct: 0.14 mg/dL (ref 0.00–0.40)
Total Protein: 6.4 g/dL (ref 6.0–8.5)

## 2019-05-05 LAB — LIPID PANEL
Chol/HDL Ratio: 2.5 ratio (ref 0.0–4.4)
Cholesterol, Total: 168 mg/dL (ref 100–199)
HDL: 68 mg/dL (ref >39)
LDL Chol Calc (NIH): 79 mg/dL (ref 0–99)
Triglycerides: 117 mg/dL (ref 0–149)
VLDL Cholesterol Cal: 21 mg/dL (ref 5–40)

## 2019-05-05 LAB — BASIC METABOLIC PANEL
BUN/Creatinine Ratio: 14 (ref 9–23)
BUN: 10 mg/dL (ref 6–24)
CO2: 21 mmol/L (ref 20–29)
Calcium: 8.7 mg/dL (ref 8.7–10.2)
Chloride: 105 mmol/L (ref 96–106)
Creatinine, Ser: 0.69 mg/dL (ref 0.57–1.00)
GFR calc Af Amer: 119 mL/min/{1.73_m2} (ref >59)
GFR calc non Af Amer: 103 mL/min/{1.73_m2} (ref >59)
Glucose: 84 mg/dL (ref 65–99)
Potassium: 4.2 mmol/L (ref 3.5–5.2)
Sodium: 139 mmol/L (ref 134–144)

## 2019-05-08 ENCOUNTER — Other Ambulatory Visit: Payer: Self-pay | Admitting: *Deleted

## 2019-05-08 DIAGNOSIS — E782 Mixed hyperlipidemia: Secondary | ICD-10-CM

## 2019-05-24 ENCOUNTER — Ambulatory Visit (INDEPENDENT_AMBULATORY_CARE_PROVIDER_SITE_OTHER): Payer: BC Managed Care – PPO | Admitting: Pharmacist

## 2019-05-24 ENCOUNTER — Other Ambulatory Visit: Payer: Self-pay

## 2019-05-24 VITALS — BP 120/82 | HR 60 | Ht 64.0 in | Wt 270.5 lb

## 2019-05-24 DIAGNOSIS — E782 Mixed hyperlipidemia: Secondary | ICD-10-CM | POA: Diagnosis not present

## 2019-05-24 MED ORDER — REPATHA SURECLICK 140 MG/ML ~~LOC~~ SOAJ: 1.0000 "pen " | SUBCUTANEOUS | 11 refills | Status: DC

## 2019-05-24 NOTE — Patient Instructions (Addendum)
It was nice to meet you today  Your LDL is currently 79 and your goal is < 55  Continue taking rosuvastatin (Crestor) 40mg  once daily  I will submit paperwork to your insurance company to see if they will cover Repatha injections (once every 2 weeks) to help lower your LDL cholesterol by 60% and to see if they will cover Vascepa (prescription fish oil - 2 capsules twice a day) to help lower your cardiovascular risk  I will call you once I hear back from your insurance company and we will plan to recheck your cholesterol in 2-3 months

## 2019-05-24 NOTE — Progress Notes (Signed)
Patient ID: Adrienne [name]                 DOB: 03/19/1971                    MRN: [id number]     HPI: Adrienne [name] is a 48 y.o. female patient referred to lipid clinic by Dr Smith. PMH is significant for NSTEMI and CAD with ostial RCA stent in 02/2018, HLD, OSA, GERD, obesity, and chronic back pain. At last visit in July, simvastatin was changed to rosuvastatin 40mg daily. Follow up lipid panel revealed LDL still elevated at 79 above goal < 55. Pt was referred to lipid clinic to discuss addition of PCSK9i and Vascepa.  Patient presents today in good spirits. She is tolerating her rosuvastatin well. Reports very strong family history of CAD, brother with 2 MIs in his 40s, mother with CAD in her 60s, maternal uncle with premature CAD. She quit smoking in July 2020, previously smoked for 35 years since the age of 15. Exercise is limited due to back pain - she is pending neurology follow up. She does not like to take medications in general but understands that her family history has played a large role in her CAD.  Current Medications: rosuvastatin 40mg daily Intolerances: none Risk Factors: CAD, NSTEMI, former tobacco abuse, obesity LDL goal: <55mg/dL  Diet: Doesn't eat until supper. Likes sandwiches, pasta, vegetables, venison, limited caffeine.  Exercise: Struggling with weight gain from Lexapro, has back pain and spinal degeneration disease - is following up with neurology.  Family History: CAD in her mother in her 60s, brother with 2 MIs in his 40s, maternal uncle with CAD.  Social History: Former tobacco abuse, quit in July 2020 after 35 years of smoking, denies alcohol and illicit drug use.   Labs: 05/04/19: TC 168, TG 117, HDL 68, LDL 79 (rosuvastatin 40mg daily)  Past Medical History:  Diagnosis Date  . CAD (coronary artery disease) 03/18/2018   S/p NSTEMI 7/19: LHC - oLAD 50, mLCx 50, oRCA 90 >> PCI:  DES to oRCA // Echo 7/19:  Mild concentric LVH, EF 65-70, normal wall motion,  normal diastolic function, trivial TR  . Coronary artery disease   . GERD (gastroesophageal reflux disease)   . Hyperlipidemia   . Morbid obesity (HCC) 07/29/2018  . OSA (obstructive sleep apnea) 07/29/2018   Severe OSA with an AHI of 48/hr and no central sleep apnea.  Her O2 sats dropped to 77% with respiratory events. She is now on auto CPAP    Current Outpatient Medications on File Prior to Visit  Medication Sig Dispense Refill  . albuterol (PROAIR HFA) 108 (90 Base) MCG/ACT inhaler Inhale 1-2 puffs into the lungs every 6 (six) hours as needed for wheezing or shortness of breath.    . ALPRAZolam (XANAX) 1 MG tablet Take 0.5-1 mg by mouth 3 (three) times daily as needed for anxiety.   0  . aspirin 81 MG tablet Take 81 mg by mouth daily.    . baclofen (LIORESAL) 10 MG tablet Take 10 mg by mouth as needed for muscle spasms.    . escitalopram (LEXAPRO) 20 MG tablet Take 10 mg by mouth daily.     . fluticasone (FLONASE) 50 MCG/ACT nasal spray Place 1 spray into both nostrils as needed for allergies.     . ibuprofen (ADVIL,MOTRIN) 200 MG tablet Take 600-800 mg by mouth every 6 (six) hours as needed (for pain, headaches, or cramps).    .   metoprolol tartrate (LOPRESSOR) 25 MG tablet Take 1 tablet by mouth twice daily 180 tablet 2  . Multiple Vitamin (MULTIVITAMIN) tablet Take 1 tablet by mouth daily.    . nitroGLYCERIN (NITROSTAT) 0.4 MG SL tablet PLACE 1 TABLET UNDER THE TONGUE EVERY 5 MINUTES AS NEEDED FOR CHEST PAIN 25 tablet 5  . omeprazole (PRILOSEC) 40 MG capsule Take 40 mg by mouth daily.  2  . ranitidine (ZANTAC) 150 MG capsule Take 150 mg by mouth as needed for heartburn.    . rosuvastatin (CRESTOR) 40 MG tablet Take 1 tablet (40 mg total) by mouth daily. 90 tablet 3   No current facility-administered medications on file prior to visit.     Allergies  Allergen Reactions  . Latex Itching and Rash    Latex allergy is to "condoms," but gloves are tolerated Latex allergy is to  "condoms," but gloves are tolerated  . Ciprofloxacin Swelling    Tongue and lips swell  . Amoxicillin Hives    Assessment/Plan:  1. Hyperlipidemia - LDL 79mg/dL on rosuvastatin 40mg daily, above goal < 55mg/dL due to extensive CAD at a young age. Discussed PCSK9i therapy and Vascepa, pt is interested in starting both to help lower her CVD risk. Repatha prior auth submitted and approved, rx sent to pharmacy to initiate Repatha 140mg SQ Q2W, $0 copay without needing copay card. Awaiting prior auth response for Vascepa 2g BID, can use copay card if approved. Will plan to recheck lipids in 2-3 months. Pt also signed informed consent for [facility] lipid registry, see below for details.  Megan E. Supple, PharmD, BCACP, CPP [facility] [address], [place], [place] [address] Phone: [phone]; Fax: [phone] 05/24/2019 3:13 PM     [employer] [id number] Site No: [id number] Subject ID No: [id number]     Did subject meet all eligibility criteria?  Code  No Yes  101  Adults age ? 40 years [] [x]  One or both of the following:   102 A  Hospitalization for a clinical ASCVD event: acute (MI), UA, IS, or CLI within 18 months of enrollment        Note: Subjects must have been admitted to the hospital. Those       who are admitted and discharged in less than 24 hours are      Eligible for the study.Subjects who have been admitted to     The ER for a clinical ASCVD. [] [x]  102B Coronary or peripheral revascularization including percutaneous or surgical revascularization in the past 18 months [] [x]  One of the following:  103 A  LDL ? 70 mg/dL (1.81 mmol/L) with no plans for immediate initiation or titration of statin therapy [] [x]  103 B Newly started on PCSK9i after the index hospitalization/procedure and prior to enrollment (but no more than 6 months prior to enrollment) with pre-PCSK9i treatment LDL-C value available and measured within 6 months of starting PCSK9i and  known background LLT any time prior to PCSK9i initiation. [x] []  105 Planned follow-up within the health system [] [x]  Subject does not meet any of the following exclusion criteria   201 Unable or unwilling to provide informed consent, including but not limited to cognitive or language barriers (reading or comprehension) [x] []  202 Lack of phone or email for contact [x] []  203 Evidence of end stage renal disease (ESRD) or stage 5 CKD [x] []  204 Anticipated life expectancy less than   6 month  [x] []  206  On a PCSK9i prior to their qualifying event [x] []     Subject Name: Adrienne [name]  Subject met inclusion and exclusion criteria.  The informed consent form, study requirements and expectations were reviewed with the subject and questions and concerns were addressed prior to the signing of the consent form.  The subject verbalized understanding of the trial requirements.  The subject agreed to participate in the [place] trial and signed the informed consent at 2:45pm on09/30/20.  The informed consent was obtained prior to performance of any protocol-specific procedures for the subject.  A copy of the signed informed consent was given to the subject and a copy was placed in the subject's medical record.    Stefan Markarian   Socioeconomic Status - Baseline  Education Some High School or Less High School graduate Some College/University Graduated College/University or above [] [x] [] []   Marital Status Married Single Divorced Separated [x] [] [] []   Income <$15,000 $15,001 - $34,999 $35,000 - $74,999 $75,000 - $99,999 >$100,000 [] [x] [] [] []

## 2019-05-26 ENCOUNTER — Telehealth: Payer: Self-pay | Admitting: Pharmacist

## 2019-05-26 DIAGNOSIS — E782 Mixed hyperlipidemia: Secondary | ICD-10-CM

## 2019-05-26 NOTE — Telephone Encounter (Signed)
Insurance has denied Vascepa coverage since TG are not > 500 or 135-499 with her ASCVD. Will recheck lipids in 2 months on PCSK9i therapy. If TG are > 135 at that time, will pursue Vascepa coverage again.

## 2019-06-14 ENCOUNTER — Encounter: Payer: Self-pay | Admitting: Interventional Cardiology

## 2019-06-14 NOTE — Telephone Encounter (Signed)
error 

## 2019-07-25 ENCOUNTER — Telehealth: Payer: Self-pay | Admitting: *Deleted

## 2019-07-25 ENCOUNTER — Other Ambulatory Visit: Payer: BC Managed Care – PPO

## 2019-07-25 NOTE — Telephone Encounter (Signed)
Virtual Visit Pre-Appointment Phone Call  "(Name), I am calling you today to discuss your upcoming appointment. We are currently trying to limit exposure to the virus that causes COVID-19 by seeing patients at home rather than in the office."  1. "What is the BEST phone number to call the day of the visit?" - include this in appointment notes  2. "Do you have or have access to (through a family member/friend) a smartphone with video capability that we can use for your visit?" a. If yes - list this number in appt notes as "cell" (if different from BEST phone #) and list the appointment type as a VIDEO visit in appointment notes b. If no - list the appointment type as a PHONE visit in appointment notes  3. Confirm consent - "In the setting of the current Covid19 crisis, you are scheduled for a (phone or video) visit with your provider on (date) at (time).  Just as we do with many in-office visits, in order for you to participate in this visit, we must obtain consent.  If you'd like, I can send this to your mychart (if signed up) or email for you to review.  Otherwise, I can obtain your verbal consent now.  All virtual visits are billed to your insurance company just like a normal visit would be.  By agreeing to a virtual visit, we'd like you to understand that the technology does not allow for your provider to perform an examination, and thus may limit your provider's ability to fully assess your condition. If your provider identifies any concerns that need to be evaluated in person, we will make arrangements to do so.  Finally, though the technology is pretty good, we cannot assure that it will always work on either your or our end, and in the setting of a video visit, we may have to convert it to a phone-only visit.  In either situation, we cannot ensure that we have a secure connection.  Are you willing to proceed?" STAFF: Did the patient verbally acknowledge consent to telehealth visit? Document  YES/NO here: YES  4. Advise patient to be prepared - "Two hours prior to your appointment, go ahead and check your blood pressure, pulse, oxygen saturation, and your weight (if you have the equipment to check those) and write them all down. When your visit starts, your provider will ask you for this information. If you have an Apple Watch or Kardia device, please plan to have heart rate information ready on the day of your appointment. Please have a pen and paper handy nearby the day of the visit as well."  5. Give patient instructions for MyChart download to smartphone OR Doximity/Doxy.me as below if video visit (depending on what platform provider is using)  6. Inform patient they will receive a phone call 15 minutes prior to their appointment time (may be from unknown caller ID) so they should be prepared to answer    Nord has been deemed a candidate for a follow-up tele-health visit to limit community exposure during the Covid-19 pandemic. I spoke with the patient via phone to ensure availability of phone/video source, confirm preferred email & phone number, and discuss instructions and expectations.  I reminded Zanaria L Mckamie to be prepared with any vital sign and/or heart rhythm information that could potentially be obtained via home monitoring, at the time of her visit. I reminded MARETTA OVERDORF to expect a phone call prior to  her visit.  Latrelle Dodrill, CMA 07/25/2019 4:45 PM   INSTRUCTIONS FOR DOWNLOADING THE MYCHART APP TO SMARTPHONE  - The patient must first make sure to have activated MyChart and know their login information - If Apple, go to [employer] and type in MyChart in the search bar and download the app. If Android, ask patient to go to [employer] and type in [place] in the search bar and download the app. The app is free but as with any other app downloads, their phone may require them to verify saved payment information  or Apple/Android password.  - The patient will need to then log into the app with their MyChart username and password, and select [facility] as their healthcare provider to link the account. When it is time for your visit, go to the MyChart app, find appointments, and click Begin Video Visit. Be sure to Select Allow for your device to access the Microphone and Camera for your visit. You will then be connected, and your provider will be with you shortly.  **If they have any issues connecting, or need assistance please contact MyChart service desk [phone])**  **If using a computer, in order to ensure the best quality for their visit they will need to use either of the following Internet Browsers: [employer] or Google Chrome**  IF USING DOXIMITY or DOXY.ME - The patient will receive a link just prior to their visit by text.     FULL LENGTH CONSENT FOR TELE-HEALTH VISIT   I hereby voluntarily request, consent and authorize [facility] and its employed or contracted physicians, physician assistants, nurse practitioners or other licensed health care professionals (the Practitioner), to provide me with telemedicine health care services (the "Services") as deemed necessary by the treating Practitioner. I acknowledge and consent to receive the Services by the Practitioner via telemedicine. I understand that the telemedicine visit will involve communicating with the Practitioner through live audiovisual communication technology and the disclosure of certain medical information by electronic transmission. I acknowledge that I have been given the opportunity to request an in-person assessment or other available alternative prior to the telemedicine visit and am voluntarily participating in the telemedicine visit.  I understand that I have the right to withhold or withdraw my consent to the use of telemedicine in the course of my care at any time, without affecting my right to future  care or treatment, and that the Practitioner or I may terminate the telemedicine visit at any time. I understand that I have the right to inspect all information obtained and/or recorded in the course of the telemedicine visit and may receive copies of available information for a reasonable fee.  I understand that some of the potential risks of receiving the Services via telemedicine include:  Marland Kitchen Delay or interruption in medical evaluation due to technological equipment failure or disruption; . Information transmitted may not be sufficient (e.g. poor resolution of images) to allow for appropriate medical decision making by the Practitioner; and/or  . In rare instances, security protocols could fail, causing a breach of personal health information.  Furthermore, I acknowledge that it is my responsibility to provide information about my medical history, conditions and care that is complete and accurate to the best of my ability. I acknowledge that Practitioner's advice, recommendations, and/or decision may be based on factors not within their control, such as incomplete or inaccurate data provided by me or distortions of diagnostic images or specimens that may result from electronic transmissions.  I understand that the practice of medicine is not an exact science and that Practitioner makes no warranties or guarantees regarding treatment outcomes. I acknowledge that I will receive a copy of this consent concurrently upon execution via email to the email address I last provided but may also request a printed copy by calling the office of [facility].    I understand that my insurance will be billed for this visit.   I have read or had this consent read to me. . I understand the contents of this consent, which adequately explains the benefits and risks of the Services being provided via telemedicine.  . I have been provided ample opportunity to ask questions regarding this consent and the Services and have  had my questions answered to my satisfaction. . I give my informed consent for the services to be provided through the use of telemedicine in my medical care  By participating in this telemedicine visit I agree to the above.

## 2019-08-03 ENCOUNTER — Other Ambulatory Visit: Payer: Self-pay

## 2019-08-03 ENCOUNTER — Encounter: Payer: BC Managed Care – PPO | Attending: Physician Assistant | Admitting: Physician Assistant

## 2019-08-03 ENCOUNTER — Other Ambulatory Visit: Payer: BC Managed Care – PPO

## 2019-08-03 DIAGNOSIS — L98412 Non-pressure chronic ulcer of buttock with fat layer exposed: Secondary | ICD-10-CM | POA: Insufficient documentation

## 2019-08-03 DIAGNOSIS — F17290 Nicotine dependence, other tobacco product, uncomplicated: Secondary | ICD-10-CM | POA: Diagnosis not present

## 2019-08-03 DIAGNOSIS — I2510 Atherosclerotic heart disease of native coronary artery without angina pectoris: Secondary | ICD-10-CM | POA: Insufficient documentation

## 2019-08-03 DIAGNOSIS — Z881 Allergy status to other antibiotic agents status: Secondary | ICD-10-CM | POA: Diagnosis not present

## 2019-08-03 DIAGNOSIS — Z880 Allergy status to penicillin: Secondary | ICD-10-CM | POA: Diagnosis not present

## 2019-08-03 DIAGNOSIS — Z91040 Latex allergy status: Secondary | ICD-10-CM | POA: Insufficient documentation

## 2019-08-03 DIAGNOSIS — Z8249 Family history of ischemic heart disease and other diseases of the circulatory system: Secondary | ICD-10-CM | POA: Insufficient documentation

## 2019-08-03 NOTE — Progress Notes (Signed)
Oneita KrasICKARD, Lupie L. ([id number]) Visit Report for 08/03/2019 Allergy List Details Patient Name: [name], [name] Date of Service: 08/03/2019 9:45 AM Medical Record Number: [id number] Patient Account Number: [id number] Date of Birth/Sex: [birth year] (48 y.o. F) Treating RN: Curtis Sitesorthy, Joanna Primary Care Ladine Kiper: Antony HasteBADGER, MICHAEL Other Clinician: Referring Santhiago Collingsworth: Illa LevelSMAN, SAHAR Treating Ursula Dermody/Extender: Linwood DibblesSTONE III, HOYT Weeks in Treatment: 0 Allergies Active Allergies condoms, latex, lubricated ciprofloxacin amoxicillin adhesive tape Allergy Notes Electronic Signature(s) Signed: 08/03/2019 3:25:11 PM By: Curtis Sitesorthy, Joanna Entered By: Curtis Sitesorthy, Joanna on 08/03/2019 10:09:12 Helwig, Keirstin L. ([id number]) -------------------------------------------------------------------------------- Arrival Information Details Patient Name: [name], [name] Date of Service: 08/03/2019 9:45 AM Medical Record Number: [id number] Patient Account Number: [id number] Date of Birth/Sex: [birth year] (48 y.o. F) Treating RN: Curtis Sitesorthy, Joanna Primary Care Ryosuke Ericksen: Antony HasteBADGER, MICHAEL Other Clinician: Referring Isaura Schiller: Illa LevelSMAN, SAHAR Treating Nysa Sarin/Extender: Linwood DibblesSTONE III, HOYT Weeks in Treatment: 0 Visit Information Patient Arrived: Ambulatory Arrival Time: 10:07 Accompanied By: self Transfer Assistance: None Patient Identification Verified: Yes Secondary Verification Process Yes Completed: Patient Has Alerts: Yes Patient Alerts: Patient on Blood Thinner aspirin 81 Electronic Signature(s) Signed: 08/03/2019 3:25:11 PM By: Curtis Sitesorthy, Joanna Entered By: Curtis Sitesorthy, Joanna on 08/03/2019 10:07:25 Stanek, Larhonda L. ([id number]) -------------------------------------------------------------------------------- Clinic Level of Care Assessment Details Patient Name: [name], [name] Date of Service: 08/03/2019 9:45 AM Medical Record Number: [id number] Patient Account Number: [id number] Date of Birth/Sex: [birth year]  (48 y.o. F) Treating RN: Rodell PernaScott, Dajea Primary Care Keylen Uzelac: Antony HasteBADGER, MICHAEL Other Clinician: Referring Saraiyah Hemminger: Illa LevelSMAN, SAHAR Treating Tashon Capp/Extender: Linwood DibblesSTONE III, HOYT Weeks in Treatment: 0 Clinic Level of Care Assessment Items TOOL 1 Quantity Score []  - Use when EandM and Procedure is performed on INITIAL visit 0 ASSESSMENTS - Nursing Assessment / Reassessment X - General Physical Exam (combine w/ comprehensive assessment (listed just below) when 1 20 performed on new pt. evals) X- 1 25 Comprehensive Assessment (HX, ROS, Risk Assessments, Wounds Hx, etc.) ASSESSMENTS - Wound and Skin Assessment / Reassessment []  - Dermatologic / Skin Assessment (not related to wound area) 0 ASSESSMENTS - Ostomy and/or Continence Assessment and Care []  - Incontinence Assessment and Management 0 []  - 0 Ostomy Care Assessment and Management (repouching, etc.) PROCESS - Coordination of Care X - Simple Patient / Family Education for ongoing care 1 15 []  - 0 Complex (extensive) Patient / Family Education for ongoing care X- 1 10 Staff obtains ChiropractorConsents, Records, Test Results / Process Orders []  - 0 Staff telephones HHA, Nursing Homes / Clarify orders / etc []  - 0 Routine Transfer to another Facility (non-emergent condition) []  - 0 Routine Hospital Admission (non-emergent condition) X- 1 15 New Admissions / Manufacturing engineernsurance Authorizations / Ordering NPWT, Apligraf, etc. []  - 0 Emergency Hospital Admission (emergent condition) PROCESS - Special Needs []  - Pediatric / Minor Patient Management 0 []  - 0 Isolation Patient Management []  - 0 Hearing / Language / Visual special needs []  - 0 Assessment of Community assistance (transportation, D/C planning, etc.) []  - 0 Additional assistance / Altered mentation []  - 0 Support Surface(s) Assessment (bed, cushion, seat, etc.) Victor, Jelena L. ([id number]) INTERVENTIONS - Miscellaneous []  - External ear exam 0 []  - 0 Patient Transfer (multiple staff /  Nurse, adultHoyer Lift / Similar devices) []  - 0 Simple Staple / Suture removal (25 or less) []  - 0 Complex Staple / Suture removal (26 or more) []  - 0 Hypo/Hyperglycemic Management (do not check if billed separately) []  - 0 Ankle / Brachial Index (ABI) - do not check if billed separately Has the patient been seen at the hospital within  the last three years: Yes Total Score: 85 Level Of Care: New/Established - Level 3 Electronic Signature(s) Signed: 08/03/2019 11:33:33 AM By: Rodell Perna Entered By: Rodell Perna on 08/03/2019 10:50:59 Vallecillo, Denijah L. ([id number]) -------------------------------------------------------------------------------- Encounter Discharge Information Details Patient Name: [name], [name] Date of Service: 08/03/2019 9:45 AM Medical Record Number: [id number] Patient Account Number: [id number] Date of Birth/Sex: [birth year] (48 y.o. F) Treating RN: Rodell Perna Primary Care Fidelis Loth: Antony Haste Other Clinician: Referring Aiza Vollrath: Illa Level Treating Jeanene Mena/Extender: Linwood Dibbles, HOYT Weeks in Treatment: 0 Encounter Discharge Information Items Post Procedure Vitals Discharge Condition: Stable Temperature (F): 98.9 Ambulatory Status: Ambulatory Pulse (bpm): 57 Discharge Destination: Home Respiratory Rate (breaths/min): 16 Transportation: Private Auto Blood Pressure (mmHg): 127/67 Accompanied By: self Schedule Follow-up Appointment: Yes Clinical Summary of Care: Electronic Signature(s) Signed: 08/03/2019 11:33:33 AM By: Rodell Perna Entered By: Rodell Perna on 08/03/2019 10:52:06 Ewton, Marijo L. ([id number]) -------------------------------------------------------------------------------- Lower Extremity Assessment Details Patient Name: [name], [name] Date of Service: 08/03/2019 9:45 AM Medical Record Number: [id number] Patient Account Number: [id number] Date of Birth/Sex: November 15, 1970 (48 y.o. F) Treating RN: Curtis Sites Primary Care  Niall Illes: Antony Haste Other Clinician: Referring Briena Swingler: Illa Level Treating Janeice Stegall/Extender: Linwood Dibbles, HOYT Weeks in Treatment: 0 Electronic Signature(s) Signed: 08/03/2019 3:25:11 PM By: Curtis Sites Entered By: Curtis Sites on 08/03/2019 10:13:59 Piechowski, Natane L. ([id number]) -------------------------------------------------------------------------------- Multi Wound Chart Details Patient Name: Brunton, Jowana L. Date of Service: 08/03/2019 9:45 AM Medical Record Number: [id number] Patient Account Number: [id number] Date of Birth/Sex: August 27, 1970 (48 y.o. F) Treating RN: Rodell Perna Primary Care Quadir Muns: Antony Haste Other Clinician: Referring Janeya Deyo: Illa Level Treating Deshundra Waller/Extender: Linwood Dibbles, HOYT Weeks in Treatment: 0 Vital Signs Height(in): 64 Pulse(bpm): 57 Weight(lbs): 270 Blood Pressure(mmHg): 127/67 Body Mass Index(BMI): 46 Temperature(F): 98.9 Respiratory Rate 16 (breaths/min): Photos: [N/A:N/A] Wound Location: Left Gluteus N/A N/A Wounding Event: Bump N/A N/A Primary Etiology: Pilonidal Cyst N/A N/A Comorbid History: Sleep Apnea, Coronary Artery N/A N/A Disease Date Acquired: 07/19/2019 N/A N/A Weeks of Treatment: 0 N/A N/A Wound Status: Open N/A N/A Measurements L x W x D 1.9x2.7x0.3 N/A N/A (cm) Area (cm) : 4.029 N/A N/A Volume (cm) : 1.209 N/A N/A Classification: Full Thickness Without N/A N/A Exposed Support Structures Exudate Amount: Medium N/A N/A Exudate Type: Serous N/A N/A Exudate Color: amber N/A N/A Wound Margin: Flat and Intact N/A N/A Granulation Amount: Medium (34-66%) N/A N/A Granulation Quality: Pink N/A N/A Necrotic Amount: Medium (34-66%) N/A N/A Exposed Structures: Fat Layer (Subcutaneous N/A N/A Tissue) Exposed: Yes Fascia: No Tendon: No Muscle: No Joint: No Bone: No Epithelialization: None N/A N/A Mccleave, Nataya L. ([id number]) Treatment Notes Electronic Signature(s) Signed: 08/03/2019  11:33:33 AM By: Rodell Perna Entered By: Rodell Perna on 08/03/2019 10:39:29 Branagan, Macarthur Critchley ([id number]) -------------------------------------------------------------------------------- Multi-Disciplinary Care Plan Details Patient Name: Zacharia, Halena L. Date of Service: 08/03/2019 9:45 AM Medical Record Number: [id number] Patient Account Number: [id number] Date of Birth/Sex: [birth year] (48 y.o. F) Treating RN: Rodell Perna Primary Care Ely Ballen: Antony Haste Other Clinician: Referring Hamsini Verrilli: Illa Level Treating Patrisia Faeth/Extender: Linwood Dibbles, HOYT Weeks in Treatment: 0 Active Inactive Orientation to the Wound Care Program Nursing Diagnoses: Knowledge deficit related to the wound healing center program Goals: Patient/caregiver will verbalize understanding of the Wound Healing Center Program Date Initiated: 08/03/2019 Target Resolution Date: 09/01/2019 Goal Status: Active Interventions: Provide education on orientation to the wound center Notes: Pain, Acute or Chronic Nursing Diagnoses: Potential alteration in comfort, pain Goals: Patient/caregiver will verbalize adequate pain control between visits Date Initiated: 08/03/2019 Target  Resolution Date: 09/01/2019 Goal Status: Active Interventions: Assess comfort goal upon admission Notes: Wound/Skin Impairment Nursing Diagnoses: Impaired tissue integrity Goals: Ulcer/skin breakdown will have a volume reduction of 30% by week 4 Date Initiated: 08/03/2019 Target Resolution Date: 08/30/2019 Goal Status: Active Interventions: Assess ulceration(s) every visit WYNONNA, FITZHENRY ([id number]) Notes: Electronic Signature(s) Signed: 08/03/2019 11:33:33 AM By: Army Melia Entered By: Army Melia on 08/03/2019 10:38:59 Hakimi, Lexani L. ([id number]) -------------------------------------------------------------------------------- Pain Assessment Details Patient Name: Herzberg, Hermie L. Date of Service: 08/03/2019 9:45  AM Medical Record Number: [id number] Patient Account Number: [id number] Date of Birth/Sex: 06/15/1971 (48 y.o. F) Treating RN: Montey Hora Primary Care Ashliegh Parekh: Anastasia Pall Other Clinician: Referring Linsy Ehresman: Lacy Duverney Treating Ritesh Opara/Extender: Melburn Hake, HOYT Weeks in Treatment: 0 Active Problems Location of Pain Severity and Description of Pain Patient Has Paino Yes Site Locations Pain Location: Pain in Ulcers With Dressing Change: Yes Duration of the Pain. Constant / Intermittento Constant Pain Management and Medication Current Pain Management: Electronic Signature(s) Signed: 08/03/2019 3:25:11 PM By: Montey Hora Entered By: Montey Hora on 08/03/2019 10:07:38 [place], Sharlette Dense ([id number]) -------------------------------------------------------------------------------- Patient/Caregiver Education Details Patient Name: Jindra, Taniesha L. Date of Service: 08/03/2019 9:45 AM Medical Record Number: [id number] Patient Account Number: [id number] Date of Birth/Gender: 21-Jan-1971 (48 y.o. F) Treating RN: Army Melia Primary Care Physician: Anastasia Pall Other Clinician: Referring Physician: Lacy Duverney Treating Physician/Extender: Sharalyn Ink in Treatment: 0 Education Assessment Education Provided To: Patient Education Topics Provided Wound/Skin Impairment: Handouts: Caring for Your Ulcer Methods: Demonstration, Explain/Verbal Responses: State content correctly Electronic Signature(s) Signed: 08/03/2019 11:33:33 AM By: Army Melia Entered By: Army Melia on 08/03/2019 10:51:26 Brissette, Sacheen L. ([id number]) -------------------------------------------------------------------------------- Wound Assessment Details Patient Name: Deakins, Sameerah L. Date of Service: 08/03/2019 9:45 AM Medical Record Number: [id number] Patient Account Number: [id number] Date of Birth/Sex: March 07, 1971 (48 y.o. F) Treating RN: Montey Hora Primary Care  Lanayah Gartley: Anastasia Pall Other Clinician: Referring Jaye Polidori: Lacy Duverney Treating Kaiyla Stahly/Extender: Melburn Hake, HOYT Weeks in Treatment: 0 Wound Status Wound Number: 1 Primary Etiology: Pilonidal Cyst Wound Location: Left Gluteus Wound Status: Open Wounding Event: Bump Comorbid History: Sleep Apnea, Coronary Artery Disease Date Acquired: 07/19/2019 Weeks Of Treatment: 0 Clustered Wound: No Photos Wound Measurements Length: (cm) 1.9 Width: (cm) 2.7 Depth: (cm) 0.3 Area: (cm) 4.029 Volume: (cm) 1.209 % Reduction in Area: % Reduction in Volume: Epithelialization: None Tunneling: No Undermining: No Wound Description Full Thickness Without Exposed Support Foul Odo Classification: Structures Slough/F Wound Margin: Flat and Intact Exudate Medium Amount: Exudate Type: Serous Exudate Color: amber r After Cleansing: No ibrino Yes Wound Bed Granulation Amount: Medium (34-66%) Exposed Structure Granulation Quality: Pink Fascia Exposed: No Necrotic Amount: Medium (34-66%) Fat Layer (Subcutaneous Tissue) Exposed: Yes Necrotic Quality: Adherent Slough Tendon Exposed: No Muscle Exposed: No Joint Exposed: No Bone Exposed: No Hegeman, Daya L. ([id number]) Treatment Notes Wound #1 (Left Gluteus) Notes prisma, BFD Electronic Signature(s) Signed: 08/03/2019 3:25:11 PM By: Montey Hora Entered By: Montey Hora on 08/03/2019 10:27:38 Reaney, Teriyah L. ([id number]) -------------------------------------------------------------------------------- Vitals Details Patient Name: Yamashiro, Tyniah L. Date of Service: 08/03/2019 9:45 AM Medical Record Number: [id number] Patient Account Number: [id number] Date of Birth/Sex: 09-22-1970 (48 y.o. F) Treating RN: Montey Hora Primary Care Maher Shon: Anastasia Pall Other Clinician: Referring Jaquin Coy: Lacy Duverney Treating Cailan Antonucci/Extender: Melburn Hake, HOYT Weeks in Treatment: 0 Vital Signs Time Taken: 10:13 Temperature  (F): 98.9 Height (in): 64 Pulse (bpm): 57 Source: Measured Respiratory Rate (breaths/min): 16 Weight (lbs): 270 Blood Pressure (mmHg): 127/67 Source: Measured Reference Range: 80 -  120 mg / dl Body Mass Index (BMI): 46.3 Electronic Signature(s) Signed: 08/03/2019 3:25:11 PM By: Curtis Sites Entered By: Curtis Sites on 08/03/2019 10:13:51

## 2019-08-03 NOTE — Progress Notes (Signed)
MACKINZE, CRIADO ([id number]) Visit Report for 08/03/2019 Abuse/Suicide Risk Screen Details Patient Name: Adrienne [name], [name] Date of Service: 08/03/2019 9:45 AM Medical Record Number: [id number] Patient Account Number: [id number] Date of Birth/Sex: Jul 19, 1971 (48 y.o. F) Treating RN: Curtis Sites Primary Care Tellis Spivak: Antony Haste Other Clinician: Referring Adelyna Brockman: Illa Level Treating Zavien Clubb/Extender: Linwood Dibbles, HOYT Weeks in Treatment: 0 Abuse/Suicide Risk Screen Items Answer ABUSE RISK SCREEN: Has anyone close to you tried to hurt or harm you recentlyo No Do you feel uncomfortable with anyone in your familyo No Has anyone forced you do things that you didnot want to doo No Electronic Signature(s) Signed: 08/03/2019 3:25:11 PM By: Curtis Sites Entered By: Curtis Sites on 08/03/2019 10:09:22 Smart, Cleopha L. ([id number]) -------------------------------------------------------------------------------- Activities of Daily Living Details Patient Name: Adrienne [name], [name] Date of Service: 08/03/2019 9:45 AM Medical Record Number: [id number] Patient Account Number: [id number] Date of Birth/Sex: [birth year] (49 y.o. F) Treating RN: Curtis Sites Primary Care Katey Barrie: Antony Haste Other Clinician: Referring Jaben Benegas: Illa Level Treating Tanyia Grabbe/Extender: Linwood Dibbles, HOYT Weeks in Treatment: 0 Activities of Daily Living Items Answer Activities of Daily Living (Please select one for each item) Drive Automobile Completely Able Take Medications Completely Able Use Telephone Completely Able Care for Appearance Completely Able Use Toilet Completely Able Bath / Shower Completely Able Dress Self Completely Able Feed Self Completely Able Walk Completely Able Get In / Out Bed Completely Able Housework Completely Able Prepare Meals Completely Able Handle Money Completely Able Shop for Self Completely Able Electronic Signature(s) Signed: 08/03/2019 3:25:11 PM  By: Curtis Sites Entered By: Curtis Sites on 08/03/2019 10:09:40 Gau, Macarthur Critchley ([id number]) -------------------------------------------------------------------------------- Education Screening Details Patient Name: Adrienne [name], [name] Date of Service: 08/03/2019 9:45 AM Medical Record Number: [id number] Patient Account Number: [id number] Date of Birth/Sex: July 27, 1971 (48 y.o. F) Treating RN: Curtis Sites Primary Care Namari Breton: Antony Haste Other Clinician: Referring Kyandre Okray: Illa Level Treating Leelan Rajewski/Extender: Linwood Dibbles, HOYT Weeks in Treatment: 0 Primary Learner Assessed: Patient Learning Preferences/Education Level/Primary Language Learning Preference: Explanation, Demonstration Highest Education Level: College or Above Preferred Language: English Cognitive Barrier Language Barrier: No Translator Needed: No Memory Deficit: No Emotional Barrier: No Cultural/Religious Beliefs Affecting Medical Care: No Physical Barrier Impaired Vision: No Impaired Hearing: No Decreased Hand dexterity: No Knowledge/Comprehension Knowledge Level: Medium Comprehension Level: Medium Ability to understand written Medium instructions: Ability to understand verbal Medium instructions: Motivation Anxiety Level: Calm Cooperation: Cooperative Education Importance: Acknowledges Need Interest in Health Problems: Asks Questions Perception: Coherent Willingness to Engage in Self- Medium Management Activities: Readiness to Engage in Self- Medium Management Activities: Electronic Signature(s) Signed: 08/03/2019 3:25:11 PM By: Curtis Sites Entered By: Curtis Sites on 08/03/2019 10:09:57 Rupe, Macarthur Critchley ([id number]) -------------------------------------------------------------------------------- Fall Risk Assessment Details Patient Name: Adrienne [name], [name] Date of Service: 08/03/2019 9:45 AM Medical Record Number: [id number] Patient Account Number: [id number] Date of  Birth/Sex: [birth year] (48 y.o. F) Treating RN: Curtis Sites Primary Care Nidia Grogan: Antony Haste Other Clinician: Referring Karston Hyland: Illa Level Treating Donnice Nielsen/Extender: Linwood Dibbles, HOYT Weeks in Treatment: 0 Fall Risk Assessment Items Have you had 2 or more falls in the last 12 monthso 0 No Have you had any fall that resulted in injury in the last 12 monthso 0 No FALLS RISK SCREEN History of falling - immediate or within 3 months 0 No Secondary diagnosis (Do you have 2 or more medical diagnoseso) 0 No Ambulatory aid None/bed rest/wheelchair/nurse 0 Yes Crutches/cane/walker 0 No Furniture 0 No Intravenous therapy Access/Saline/Heparin Lock 0 No Gait/Transferring Normal/ bed rest/ wheelchair 0 Yes  Weak (short steps with or without shuffle, stooped but able to lift head while 0 No walking, may seek support from furniture) Impaired (short steps with shuffle, may have difficulty arising from chair, head 0 No down, impaired balance) Mental Status Oriented to own ability 0 Yes Electronic Signature(s) Signed: 08/03/2019 3:25:11 PM By: Montey Hora Entered By: Montey Hora on 08/03/2019 10:10:06 Sickles, Erda ([id number]) -------------------------------------------------------------------------------- Foot Assessment Details Patient Name: Adrienne [name], [name] Date of Service: 08/03/2019 9:45 AM Medical Record Number: [id number] Patient Account Number: [id number] Date of Birth/Sex: 1970/10/18 (48 y.o. F) Treating RN: Montey Hora Primary Care Anel Creighton: Anastasia Pall Other Clinician: Referring Najwa Spillane: Lacy Duverney Treating Kyelle Urbas/Extender: Melburn Hake, HOYT Weeks in Treatment: 0 Foot Assessment Items Site Locations + = Sensation present, - = Sensation absent, C = Callus, U = Ulcer R = Redness, W = Warmth, M = Maceration, PU = Pre-ulcerative lesion F = Fissure, S = Swelling, D = Dryness Assessment Right: Left: Other Deformity: No No Prior Foot Ulcer: No  No Prior Amputation: No No Charcot Joint: No No Ambulatory Status: Ambulatory Without Help Gait: Steady Electronic Signature(s) Signed: 08/03/2019 3:25:11 PM By: Montey Hora Entered By: Montey Hora on 08/03/2019 10:10:19 Washko, Minda L. ([id number]) -------------------------------------------------------------------------------- Nutrition Risk Screening Details Patient Name: Adrienne [name], [name] Date of Service: 08/03/2019 9:45 AM Medical Record Number: [id number] Patient Account Number: [id number] Date of Birth/Sex: 1971-07-19 (48 y.o. F) Treating RN: Montey Hora Primary Care Lindora Alviar: Anastasia Pall Other Clinician: Referring Shonta Bourque: Lacy Duverney Treating Wane Mollett/Extender: Melburn Hake, HOYT Weeks in Treatment: 0 Height (in): Weight (lbs): Body Mass Index (BMI): Nutrition Risk Screening Items Score Screening NUTRITION RISK SCREEN: I have an illness or condition that made me change the kind and/or amount of 0 No food I eat I eat fewer than two meals per day 0 No I eat few fruits and vegetables, or milk products 0 No I have three or more drinks of beer, liquor or wine almost every day 0 No I have tooth or mouth problems that make it hard for me to eat 0 No I don't always have enough money to buy the food I need 0 No I eat alone most of the time 0 No I take three or more different prescribed or over-the-counter drugs a day 1 Yes Without wanting to, I have lost or gained 10 pounds in the last six months 0 No I am not always physically able to shop, cook and/or feed myself 0 No Nutrition Protocols Good Risk Protocol 0 No interventions needed Moderate Risk Protocol High Risk Proctocol Risk Level: Good Risk Score: 1 Electronic Signature(s) Signed: 08/03/2019 3:25:11 PM By: Montey Hora Entered By: Montey Hora on 08/03/2019 10:10:12

## 2019-08-04 NOTE — Progress Notes (Addendum)
Adrienne, [name] ([id number]) Visit Report for 08/03/2019 Chief Complaint Document Details Patient Name: [name], [name] Date of Service: 08/03/2019 9:45 AM Medical Record Number: [id number] Patient Account Number: [id number] Date of Birth/Sex: September 05, 1970 (48 y.o. F) Treating RN: Rodell Perna Primary Care Provider: Antony Haste Other Clinician: Referring Provider: Illa Level Treating Provider/Extender: Linwood Dibbles, Sri Clegg Weeks in Treatment: 0 Information Obtained from: Patient Chief Complaint Left gluteal ulcer secondary to pilonidal cyst removal Electronic Signature(s) Signed: 08/03/2019 10:35:22 AM By: Lenda Kelp PA-C Entered By: Lenda Kelp on 08/03/2019 10:35:21 [name], [name] ([id number]) -------------------------------------------------------------------------------- Debridement Details Patient Name: [name], [name] Date of Service: 08/03/2019 9:45 AM Medical Record Number: [id number] Patient Account Number: [id number] Date of Birth/Sex: 11/19/1970 (48 y.o. F) Treating RN: Rodell Perna Primary Care Provider: Antony Haste Other Clinician: Referring Provider: Illa Level Treating Provider/Extender: Linwood Dibbles, Smitty Ackerley Weeks in Treatment: 0 Debridement Performed for Wound #1 Left Gluteus Assessment: Performed By: Physician STONE III, Tristin Vandeusen E., PA-C Debridement Type: Debridement Level of Consciousness (Pre- Awake and Alert procedure): Pre-procedure Verification/Time Yes - 10:48 Out Taken: Start Time: 10:49 Total Area Debrided (L x W): 1.9 (cm) x 2.7 (cm) = 5.13 (cm) Tissue and other material Viable, Non-Viable, Slough, Subcutaneous, Slough debrided: Level: Skin/Subcutaneous Tissue Debridement Description: Excisional Instrument: Curette Bleeding: Minimum Hemostasis Achieved: Pressure End Time: 10:49 Response to Treatment: Procedure was tolerated well Level of Consciousness Awake and Alert (Post-procedure): Post Debridement Measurements of  Total Wound Length: (cm) 1.9 Width: (cm) 2.7 Depth: (cm) 0.3 Volume: (cm) 1.209 Character of Wound/Ulcer Post Debridement: Stable Post Procedure Diagnosis Same as Pre-procedure Electronic Signature(s) Signed: 08/03/2019 11:33:33 AM By: Rodell Perna Signed: 08/04/2019 10:20:25 AM By: Lenda Kelp PA-C Entered By: Rodell Perna on 08/03/2019 10:49:07 [name], [name] ([id number]) -------------------------------------------------------------------------------- HPI Details Patient Name: [name], [name] Date of Service: 08/03/2019 9:45 AM Medical Record Number: [id number] Patient Account Number: [id number] Date of Birth/Sex: [birth year] (48 y.o. F) Treating RN: Rodell Perna Primary Care Provider: Antony Haste Other Clinician: Referring Provider: Illa Level Treating Provider/Extender: Linwood Dibbles, Ashari Llewellyn Weeks in Treatment: 0 History of Present Illness HPI Description: 08/03/2019 on evaluation today patient actually presents for evaluation concerning a left gluteal pilonidal cyst which occurred roughly 3 weeks ago. She was actually seen [facility] where they did remove the cyst based on what the patient tells me today. She also showed me the pictures. With that being said she tells me that since that time this has been showing some signs of improvement although unfortunately she still having a lot of discomfort at times it is just a very awkward spot. She does have a history of heart disease although she seems to be doing well in that regard at this point. There is no signs of active infection at this time. She tells me that they did attempt to suture this closed once this was removed although based on what I am seeing it looks like that basically she just had to heal by second intent allowing granulation/scar tissue to fill in at this point. Again it looks like there was probably quite a bit of a cavity that was exposed and removed as a result of the procedure and  again that somewhat difficult sometimes to suture back closed. Electronic Signature(s) Signed: 08/07/2019 10:15:49 AM By: Lenda Kelp PA-C Previous Signature: 08/04/2019 10:17:38 AM Version By: Lenda Kelp PA-C Entered By: Lenda Kelp on 08/07/2019 10:15:49 Adrienne [name] ([id number]) -------------------------------------------------------------------------------- Physical Exam Details Patient Name: [name],  [name] Date of Service: 08/03/2019 9:45 AM Medical Record Number: [id number] Patient Account Number: [id number] Date of Birth/Sex: 06/15/1971 (48 y.o. F) Treating RN: Rodell Perna Primary Care Provider: Antony Haste Other Clinician: Referring Provider: Illa Level Treating Provider/Extender: Linwood Dibbles, Forestine Macho Weeks in Treatment: 0 Constitutional sitting or standing blood pressure is within target range for patient.. pulse regular and within target range for patient.Marland Kitchen respirations regular, non-labored and within target range for patient.Marland Kitchen temperature within target range for patient.. Well- nourished and well-hydrated in no acute distress. Eyes conjunctiva clear no eyelid edema noted. pupils equal round and reactive to light and accommodation. Ears, Nose, Mouth, and Throat no gross abnormality of ear auricles or external auditory canals. normal hearing noted during conversation. mucus membranes moist. Respiratory normal breathing without difficulty. clear to auscultation bilaterally. Cardiovascular regular rate and rhythm with normal S1, S2. no clubbing, cyanosis, significant edema, <3 sec cap refill. Gastrointestinal (GI) soft, non-tender, non-distended, +BS. no ventral hernia noted. Musculoskeletal normal gait and posture. no significant deformity or arthritic changes, no loss or range of motion, no clubbing. Psychiatric this patient is able to make decisions and demonstrates good insight into disease process. Alert and Oriented x 3. pleasant and  cooperative. Notes Upon inspection today patient's wound bed actually showed signs of good granulation at this time. Fortunately there is no evidence of active infection which is good news. No fevers, chills, nausea, vomiting, or diarrhea. She actually appears to have fairly good granulation though there was some slough noted on the surface of the wound this did require sharp debridement to clear away some of the necrotic material to allow for more appropriate epithelialization going forward. She tolerated the debridement today without complication and post debridement the wound bed appears to be doing much better which is great news. There does not appear to be a pressure component to this. Electronic Signature(s) Signed: 08/04/2019 10:18:28 AM By: Lenda Kelp PA-C Entered By: Lenda Kelp on 08/04/2019 10:18:27 Adrienne [name] ([id number]) -------------------------------------------------------------------------------- Physician Orders Details Patient Name: [name], [name] Date of Service: 08/03/2019 9:45 AM Medical Record Number: [id number] Patient Account Number: [id number] Date of Birth/Sex: 1971/02/05 (48 y.o. F) Treating RN: Rodell Perna Primary Care Provider: Antony Haste Other Clinician: Referring Provider: Illa Level Treating Provider/Extender: Linwood Dibbles, Ashaki Frosch Weeks in Treatment: 0 Verbal / Phone Orders: No Diagnosis Coding ICD-10 Coding Code Description L05.01 Pilonidal cyst with abscess L98.412 Non-pressure chronic ulcer of buttock with fat layer exposed I25.10 Atherosclerotic heart disease of native coronary artery without angina pectoris Wound Cleansing Wound #1 Left Gluteus o Clean wound with Normal Saline. o May Shower, gently pat wound dry prior to applying new dressing. Skin Barriers/Peri-Wound Care Wound #1 Left Gluteus o Skin Prep Primary Wound Dressing Wound #1 Left Gluteus o Silver Collagen Secondary Dressing Wound #1 Left  Gluteus o Boardered Foam Dressing Dressing Change Frequency Wound #1 Left Gluteus o Change dressing every other day. Follow-up Appointments Wound #1 Left Gluteus o Return Appointment in 1 week. Electronic Signature(s) Signed: 08/03/2019 11:33:33 AM By: Rodell Perna Signed: 08/04/2019 10:20:25 AM By: Lenda Kelp PA-C Entered By: Rodell Perna on 08/03/2019 10:50:05 Mayon, Adrienne [name] LMarland Kitchen ([id number]) -------------------------------------------------------------------------------- Problem List Details Patient Name: [name], [name] Date of Service: 08/03/2019 9:45 AM Medical Record Number: [id number] Patient Account Number: [id number] Date of Birth/Sex: 02/15/1971 (48 y.o. F) Treating RN: Rodell Perna Primary Care Provider: Antony Haste Other Clinician: Referring Provider: Illa Level Treating Provider/Extender: Linwood Dibbles, Khylee Algeo Weeks in Treatment: 0 Active Problems ICD-10 Evaluated  Encounter Code Description Active Date Today Diagnosis L05.01 Pilonidal cyst with abscess 08/03/2019 No Yes L98.412 Non-pressure chronic ulcer of buttock with fat layer exposed 08/03/2019 No Yes I25.10 Atherosclerotic heart disease of native coronary artery 08/03/2019 No Yes without angina pectoris Inactive Problems Resolved Problems Electronic Signature(s) Signed: 08/03/2019 10:34:03 AM By: Worthy Keeler PA-C Entered By: Worthy Keeler on 08/03/2019 10:34:03 Zuluaga, Lexia L. ([id number]) -------------------------------------------------------------------------------- Progress Note Details Patient Name: [name], [name] Date of Service: 08/03/2019 9:45 AM Medical Record Number: [id number] Patient Account Number: [id number] Date of Birth/Sex: [birth year] (48 y.o. F) Treating RN: Army Melia Primary Care Provider: Anastasia Pall Other Clinician: Referring Provider: Lacy Duverney Treating Provider/Extender: Melburn Hake, Rayleigh Gillyard Weeks in Treatment: 0 Subjective Chief  Complaint Information obtained from Patient Left gluteal ulcer secondary to pilonidal cyst removal History of Present Illness (HPI) 08/03/2019 on evaluation today patient actually presents for evaluation concerning a left gluteal pilonidal cyst which occurred roughly 3 weeks ago. [facility] was [facility] where they did remove the cyst based on what the patient tells me today. She also showed me the pictures. With that being said she tells me that since that time this has been showing some signs of improvement although unfortunately she still having a lot of discomfort at times it is just a very awkward spot. She does have a history of heart disease although she seems to be doing well in that regard at this point. There is no signs of active infection at this time. She tells me that they did attempt to suture this closed once this was removed although based on what I am seeing it looks like that basically she just had to heal by second intent allowing granulation/scar tissue to fill in at this point. Again it looks like there was probably quite a bit of a cavity that was exposed and removed as a result of the procedure and again that somewhat difficult sometimes to suture back closed. Patient History Information obtained from Patient. Allergies condoms, latex, lubricated, ciprofloxacin, amoxicillin, adhesive tape Family History Heart Disease - Mother, Hypertension - Mother, No family history of Cancer, Diabetes, Hereditary Spherocytosis, Kidney Disease, Lung Disease, Seizures, Stroke, Thyroid Problems, Tuberculosis. Social History Former smoker - vapes at times, Marital Status - Married, Alcohol Use - Rarely, Drug Use - No History, Caffeine Use - Daily. Medical History Respiratory Patient has history of Sleep Apnea Denies history of Aspiration, Asthma, Chronic Obstructive Pulmonary Disease (COPD), Pneumothorax, Tuberculosis Cardiovascular Patient has history of  Coronary Artery Disease Denies history of Angina, Arrhythmia, Congestive Heart Failure, Deep Vein Thrombosis, Hypertension, Hypotension, Myocardial Infarction, Peripheral Arterial Disease, Peripheral Venous Disease, Phlebitis, Vasculitis Gastrointestinal Denies history of Cirrhosis , Colitis, Crohn s, Hepatitis A, Hepatitis B, Hepatitis C Integumentary (Skin) Denies history of History of Burn, History of pressure wounds Medical And Surgical History Notes Cardiovascular Rumler, Zanylah L. ([id number]) hyperlipidemia Gastrointestinal GERD Integumentary (Skin) hx hidradenitis Review of Systems (ROS) Constitutional Symptoms (General Health) Denies complaints or symptoms of Fatigue, Fever, Chills, Marked Weight Change. Eyes Denies complaints or symptoms of Dry Eyes, Vision Changes, Glasses / Contacts. Ear/Nose/Mouth/Throat Denies complaints or symptoms of Difficult clearing ears, Sinusitis. Hematologic/Lymphatic Denies complaints or symptoms of Bleeding / Clotting Disorders, Human Immunodeficiency Virus. Respiratory Denies complaints or symptoms of Chronic or frequent coughs, Shortness of Breath. Cardiovascular Denies complaints or symptoms of Chest pain, LE edema. Gastrointestinal Denies complaints or symptoms of Frequent diarrhea, Nausea, Vomiting. Endocrine Denies complaints or symptoms of Hepatitis, Thyroid disease, Polydypsia (  Excessive Thirst). Genitourinary Denies complaints or symptoms of Kidney failure/ Dialysis, Incontinence/dribbling. Immunological Denies complaints or symptoms of Hives, Itching. Integumentary (Skin) Complains or has symptoms of Wounds. Denies complaints or symptoms of Bleeding or bruising tendency, Breakdown, Swelling. Musculoskeletal Denies complaints or symptoms of Muscle Pain, Muscle Weakness. Neurologic Denies complaints or symptoms of Numbness/parasthesias, Focal/Weakness. Psychiatric Denies complaints or symptoms of Anxiety,  Claustrophobia. Objective Constitutional sitting or standing blood pressure is within target range for patient.. pulse regular and within target range for patient.Marland Kitchen. respirations regular, non-labored and within target range for patient.Marland Kitchen. temperature within target range for patient.. Well- nourished and well-hydrated in no acute distress. Vitals Time Taken: 10:13 AM, Height: 64 in, Source: Measured, Weight: 270 lbs, Source: Measured, BMI: 46.3, Temperature: 98.9 F, Pulse: 57 bpm, Respiratory Rate: 16 breaths/min, Blood Pressure: 127/67 mmHg. Eyes conjunctiva clear no eyelid edema noted. pupils equal round and reactive to light and accommodation. Genest, Ivorie L. ([id number]) Ears, Nose, Mouth, and Throat no gross abnormality of ear auricles or external auditory canals. normal hearing noted during conversation. mucus membranes moist. Respiratory normal breathing without difficulty. clear to auscultation bilaterally. Cardiovascular regular rate and rhythm with normal S1, S2. no clubbing, cyanosis, significant edema, Gastrointestinal (GI) soft, non-tender, non-distended, +BS. no ventral hernia noted. Musculoskeletal normal gait and posture. no significant deformity or arthritic changes, no loss or range of motion, no clubbing. Psychiatric this patient is able to make decisions and demonstrates good insight into disease process. Alert and Oriented x 3. pleasant and cooperative. General Notes: Upon inspection today patient's wound bed actually showed signs of good granulation at this time. Fortunately there is no evidence of active infection which is good news. No fevers, chills, nausea, vomiting, or diarrhea. She actually appears to have fairly good granulation though there was some slough noted on the surface of the wound this did require sharp debridement to clear away some of the necrotic material to allow for more appropriate epithelialization going forward. She tolerated the  debridement today without complication and post debridement the wound bed appears to be doing much better which is great news. There does not appear to be a pressure component to this. Integumentary (Hair, Skin) Wound #1 status is Open. Original cause of wound was Bump. The wound is located on the Left Gluteus. The wound measures 1.9cm length x 2.7cm width x 0.3cm depth; 4.029cm^2 area and 1.209cm^3 volume. There is Fat Layer (Subcutaneous Tissue) Exposed exposed. There is no tunneling or undermining noted. There is a medium amount of serous drainage noted. The wound margin is flat and intact. There is medium (34-66%) pink granulation within the wound bed. There is a medium (34-66%) amount of necrotic tissue within the wound bed including Adherent Slough. Assessment Active Problems ICD-10 Pilonidal cyst with abscess Non-pressure chronic ulcer of buttock with fat layer exposed Atherosclerotic heart disease of native coronary artery without angina pectoris Procedures Wound #1 Pre-procedure diagnosis of Wound #1 is a Pilonidal Cyst located on the Left Gluteus . There was a Excisional Skin/Subcutaneous Tissue Debridement with a total area of 5.13 sq cm performed by STONE III, Raunak Antuna E., PA-C. With the following instrument(s): Curette to remove Viable and Non-Viable tissue/material. Material removed includes Subcutaneous Tissue and Slough and. A time out was conducted at 10:48, prior to the start of the procedure. A Minimum amount of bleeding Marshburn, Stephaie L. ([id number]) was controlled with Pressure. The procedure was tolerated well. Post Debridement Measurements: 1.9cm length x 2.7cm width x 0.3cm depth; 1.209cm^3 volume. Character of Wound/Ulcer  Post Debridement is stable. Post procedure Diagnosis Wound #1: Same as Pre-Procedure Plan Wound Cleansing: Wound #1 Left Gluteus: Clean wound with Normal Saline. May Shower, gently pat wound dry prior to applying new dressing. Skin  Barriers/Peri-Wound Care: Wound #1 Left Gluteus: Skin Prep Primary Wound Dressing: Wound #1 Left Gluteus: Silver Collagen Secondary Dressing: Wound #1 Left Gluteus: Boardered Foam Dressing Dressing Change Frequency: Wound #1 Left Gluteus: Change dressing every other day. Follow-up Appointments: Wound #1 Left Gluteus: Return Appointment in 1 week. 1. Based on what I am seeing today I think that ideally the patient would do well with a silver collagen dressing especially after we clean the wound I feel like she is doing quite well. I recommend covering this with a border foam which will be somewhat water resistant and again should help her with comfort as well. 2. I do recommend that she change the dressing every other day this will help keep things nice and clean she can shower in between I see no issues with that. 3. I would also recommend that the patient continue with offloading ensuring that she is not sitting for too long which could aggravate and slow down healing with that being said I do not think I see any evidence of this occurring. 4. I did advise her that she does have a little bit of a limp where the epithelial tissue has grown down and made somewhat of a little ridge on the cephalad portion of the wound. Nonetheless I think this is something that likely is good to be just a small scar area when it is all said and done but again I did want her to be aware of this. We will see patient back for reevaluation in 1 week here in the clinic. If anything worsens or changes patient will contact our office for additional recommendations. Electronic Signature(s) Signed: 08/07/2019 10:16:09 AM By: Lenda Kelp PA-C Previous Signature: 08/04/2019 10:20:08 AM Version By: Lenda Kelp PA-C Entered By: Lenda Kelp on 08/07/2019 10:16:09 Machnik, Lamica LMarland Kitchen ([id number]) -------------------------------------------------------------------------------- ROS/PFSH Details Patient  Name: Bowditch, Janaisa L. Date of Service: 08/03/2019 9:45 AM Medical Record Number: [id number] Patient Account Number: [id number] Date of Birth/Sex: Feb 04, 1971 (48 y.o. F) Treating RN: Curtis Sites Primary Care Provider: Antony Haste Other Clinician: Referring Provider: Illa Level Treating Provider/Extender: Linwood Dibbles, Lauriel Helin Weeks in Treatment: 0 Information Obtained From Patient Constitutional Symptoms (General Health) Complaints and Symptoms: Negative for: Fatigue; Fever; Chills; Marked Weight Change Eyes Complaints and Symptoms: Negative for: Dry Eyes; Vision Changes; Glasses / Contacts Ear/Nose/Mouth/Throat Complaints and Symptoms: Negative for: Difficult clearing ears; Sinusitis Hematologic/Lymphatic Complaints and Symptoms: Negative for: Bleeding / Clotting Disorders; Human Immunodeficiency Virus Respiratory Complaints and Symptoms: Negative for: Chronic or frequent coughs; Shortness of Breath Medical History: Positive for: Sleep Apnea Negative for: Aspiration; Asthma; Chronic Obstructive Pulmonary Disease (COPD); Pneumothorax; Tuberculosis Cardiovascular Complaints and Symptoms: Negative for: Chest pain; LE edema Medical History: Positive for: Coronary Artery Disease Negative for: Angina; Arrhythmia; Congestive Heart Failure; Deep Vein Thrombosis; Hypertension; Hypotension; Myocardial Infarction; Peripheral Arterial Disease; Peripheral Venous Disease; Phlebitis; Vasculitis Past Medical History Notes: hyperlipidemia Gastrointestinal Complaints and Symptoms: Negative for: Frequent diarrhea; Nausea; Vomiting Medical History: Negative for: Cirrhosis ; Colitis; Crohnos; Hepatitis A; Hepatitis B; Hepatitis C Fessenden, Kitty L. ([id number]) Past Medical History Notes: GERD Endocrine Complaints and Symptoms: Negative for: Hepatitis; Thyroid disease; Polydypsia (Excessive Thirst) Genitourinary Complaints and Symptoms: Negative for: Kidney failure/ Dialysis;  Incontinence/dribbling Immunological Complaints and Symptoms: Negative for: Hives; Itching Integumentary (Skin) Complaints  and Symptoms: Positive for: Wounds Negative for: Bleeding or bruising tendency; Breakdown; Swelling Medical History: Negative for: History of Burn; History of pressure wounds Past Medical History Notes: hx hidradenitis Musculoskeletal Complaints and Symptoms: Negative for: Muscle Pain; Muscle Weakness Neurologic Complaints and Symptoms: Negative for: Numbness/parasthesias; Focal/Weakness Psychiatric Complaints and Symptoms: Negative for: Anxiety; Claustrophobia Oncologic Immunizations Pneumococcal Vaccine: Received Pneumococcal Vaccination: No Implantable Devices None Family and Social History Cancer: No; Diabetes: No; Heart Disease: Yes - Mother; Hereditary Spherocytosis: No; Hypertension: Yes - Mother; Kidney Disease: No; Lung Disease: No; Seizures: No; Stroke: No; Thyroid Problems: No; Tuberculosis: No; Former smoker - vapes at times; Marital Status - Married; Alcohol Use: Rarely; Drug Use: No History; Caffeine Use: Daily; Financial Concerns: No; Food, Clothing or Shelter Needs: No; Support System Lacking: No; Transportation Concerns: No Radloff, Haylo L. ([id number]) Electronic Signature(s) Signed: 08/03/2019 3:25:11 PM By: Curtis Sites Signed: 08/04/2019 10:20:25 AM By: Lenda Kelp PA-C Entered By: Curtis Sites on 08/03/2019 10:17:37 Houseworth, Dajanee L. ([id number]) -------------------------------------------------------------------------------- SuperBill Details Patient Name: Panuco, Larry L. Date of Service: 08/03/2019 Medical Record Number: [id number] Patient Account Number: [id number] Date of Birth/Sex: February 26, 1971 (48 y.o. F) Treating RN: Rodell Perna Primary Care Provider: Antony Haste Other Clinician: Referring Provider: Illa Level Treating Provider/Extender: Linwood Dibbles, Drakkar Medeiros Weeks in Treatment: 0 Diagnosis Coding ICD-10  Codes Code Description L05.01 Pilonidal cyst with abscess L98.412 Non-pressure chronic ulcer of buttock with fat layer exposed I25.10 Atherosclerotic heart disease of native coronary artery without angina pectoris Facility Procedures CPT4 Code: 56433295 Description: 99213 - WOUND CARE VISIT-LEV 3 EST PT Modifier: Quantity: 1 CPT4 Code: 18841660 Description: 11042 - DEB SUBQ TISSUE 20 SQ CM/< ICD-10 Diagnosis Description L98.412 Non-pressure chronic ulcer of buttock with fat layer exposed Modifier: Quantity: 1 Physician Procedures CPT4 Code Description: [id number] WC PHYS LEVEL 3 o NEW PT ICD-10 Diagnosis Description L05.01 Pilonidal cyst with abscess L98.412 Non-pressure chronic ulcer of buttock with fat layer exposed I25.10 Atherosclerotic heart disease of native coronary artery  without Modifier: 25 angina pectori Quantity: 1 s CPT4 Code Description: [id number] 11042 - WC PHYS SUBQ TISS 20 SQ CM ICD-10 Diagnosis Description 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

## 2019-08-08 ENCOUNTER — Other Ambulatory Visit: Payer: Self-pay

## 2019-08-08 ENCOUNTER — Telehealth (INDEPENDENT_AMBULATORY_CARE_PROVIDER_SITE_OTHER): Payer: BC Managed Care – PPO | Admitting: Cardiology

## 2019-08-08 ENCOUNTER — Other Ambulatory Visit: Payer: BC Managed Care – PPO

## 2019-08-08 ENCOUNTER — Encounter: Payer: Self-pay | Admitting: Cardiology

## 2019-08-08 VITALS — Ht 64.0 in

## 2019-08-08 DIAGNOSIS — G4733 Obstructive sleep apnea (adult) (pediatric): Secondary | ICD-10-CM

## 2019-08-08 NOTE — Progress Notes (Signed)
Virtual Visit via Telephone Note   This visit type was conducted due to national recommendations for restrictions regarding the COVID-19 Pandemic (e.g. social distancing) in an effort to limit this patient's exposure and mitigate transmission in our community.  Due to her co-morbid illnesses, this patient is at least at moderate risk for complications without adequate follow up.  This format is felt to be most appropriate for this patient at this time.  The patient did not have access to video technology/had technical difficulties with video requiring transitioning to audio format only (telephone).  All issues noted in this document were discussed and addressed.  No physical exam could be performed with this format.  Please refer to the patient's chart for her  consent to telehealth for [facility].   Evaluation Performed:  Follow-up visit  This visit type was conducted due to national recommendations for restrictions regarding the COVID-19 Pandemic (e.g. social distancing).  This format is felt to be most appropriate for this patient at this time.  All issues noted in this document were discussed and addressed.  No physical exam was performed (except for noted visual exam findings with Video Visits).  Please refer to the patient's chart (MyChart message for video visits and phone note for telephone visits) for the patient's consent to telehealth for [facility].  Date:  08/08/2019   ID:  Adrienne [name], DOB 08-26-1970, MRN [id number]  Patient Location:  Home  [place] location[place]   [place]  PCP:  Eartha InchBadger, Michael C, MD  Cardiologist:  Verdis PrimeHenry Smith, MD Sleep Medicine:  Armanda Magicraci Mackenzy Grumbine, MD Electrophysiologist:  None   Chief Complaint:  OSA  History of Present Illness:    Adrienne [name] is a 48 y.o. female who presents via audio/video conferencing for a telehealth visit today.    Adrienne [name] is a 48 y.o. female with a hx of ASCAD who was referred for sleep study and was  found to have severe OSA with an AHI of 48/hr and no central sleep apnea.  Her O2 sats dropped to 77% with respiratory events.  She was started on auto CPAP.  She is doing well with her CPAP device and thinks that she has gotten used to it.  She tolerates the mask and feels the pressure is adequate.  Since going on CPAP she feels rested in the am and has no significant daytime sleepiness.  She denies any significant mouth or nasal dryness or nasal congestion.  She does not think that he snores.    The patient does not have symptoms concerning for COVID-19 infection (fever, chills, cough, or new shortness of breath).    Prior CV studies:   The following studies were reviewed today:  PAP compliance download  Past Medical History:  Diagnosis Date  . CAD (coronary artery disease) 03/18/2018   S/p NSTEMI 7/19: LHC - oLAD 50, mLCx 50, oRCA 90 >> [facility]  [facility] Echo 7/19:  Mild concentric LVH, EF 65-70, normal wall motion, normal diastolic function, trivial TR  . Coronary artery disease   . GERD (gastroesophageal reflux disease)   . Hyperlipidemia   . Morbid obesity (HCC) 07/29/2018  . OSA (obstructive sleep apnea) 07/29/2018   Severe OSA with an AHI of 48/hr and no central sleep apnea.  Her O2 sats dropped to 77% with respiratory events. She is now on auto CPAP   Past Surgical History:  Procedure Laterality Date  . CARDIAC CATHETERIZATION  02/25/2018  . CESAREAN SECTION    .  CORONARY STENT INTERVENTION N/A 02/25/2018   Procedure: CORONARY STENT INTERVENTION;  Surgeon: Tonny Bollman, MD;  Location[facility] INVASIVE CV LAB;  Service: Cardiovascular;  Laterality: N/A;  . finger surgery    . LEFT HEART CATH AND CORONARY ANGIOGRAPHY N/A 02/23/2018   Procedure: LEFT HEART CATH AND CORONARY ANGIOGRAPHY;  Surgeon: Kathleene Hazel, MD;  Location: [facility] INVASIVE CV LAB;  Service: Cardiovascular;  Laterality: N/A;     Current Meds  Medication Sig  . albuterol (PROAIR HFA) 108 (90 Base) MCG/ACT  inhaler Inhale 1-2 puffs into the lungs every 6 (six) hours as needed for wheezing or shortness of breath.  . ALPRAZolam (XANAX) 1 MG tablet Take 0.5-1 mg by mouth 3 (three) times daily as needed for anxiety.   Marland Kitchen aspirin 81 MG tablet Take 81 mg by mouth daily.  . baclofen (LIORESAL) 10 MG tablet Take 10 mg by mouth as needed for muscle spasms.  Marland Kitchen escitalopram (LEXAPRO) 20 MG tablet Take 10 mg by mouth daily.   . Evolocumab (REPATHA SURECLICK) 140 MG/ML SOAJ Inject 1 pen into the skin every 14 (fourteen) days.  . fluticasone (FLONASE) 50 MCG/ACT nasal spray Place 1 spray into both nostrils as needed for allergies.   Marland Kitchen ibuprofen (ADVIL,MOTRIN) 200 MG tablet Take 600-800 mg by mouth every 6 (six) hours as needed (for pain, headaches, or cramps).  . metoprolol tartrate (LOPRESSOR) 25 MG tablet Take 1 tablet by mouth twice daily  . Multiple Vitamin (MULTIVITAMIN) tablet Take 1 tablet by mouth daily.  . nitroGLYCERIN (NITROSTAT) 0.4 MG SL tablet PLACE 1 TABLET UNDER THE TONGUE EVERY 5 MINUTES AS NEEDED FOR CHEST PAIN  . omeprazole (PRILOSEC) 40 MG capsule Take 40 mg by mouth daily.  . ranitidine (ZANTAC) 150 MG capsule Take 150 mg by mouth as needed for heartburn.     Allergies:   Latex, Ciprofloxacin, and Amoxicillin   Social History   Tobacco Use  . Smoking status: Former Smoker    Types: Cigarettes    Quit date: 02/22/2018    Years since quitting: 1.4  . Smokeless tobacco: Never Used  Substance Use Topics  . Alcohol use: No  . Drug use: No     Family Hx: The patient's family history includes CAD in her mother.  ROS:   Please see the history of present illness.     All other systems reviewed and are negative.   Labs/Other Tests and Data Reviewed:    Recent Labs: 05/04/2019: ALT 23; BUN 10; Creatinine, Ser 0.69; Potassium 4.2; Sodium 139   Recent Lipid Panel Lab Results  Component Value Date/Time   CHOL 168 05/04/2019 04:00 PM   TRIG 117 05/04/2019 04:00 PM   HDL 68  05/04/2019 04:00 PM   CHOLHDL 2.5 05/04/2019 04:00 PM   CHOLHDL 3.4 02/26/2018 04:17 AM   LDLCALC 79 05/04/2019 04:00 PM    Wt Readings from Last 3 Encounters:  05/24/19 270 lb 8 oz (122.7 kg)  03/20/19 264 lb 8 oz (120 kg)  12/20/18 249 lb (112.9 kg)     Objective:    Vital Signs:  Ht 5\' 4"  (1.626 m)   BMI 46.43 kg/m    ASSESSMENT & PLAN:    1.  OSA -The patient is tolerating PAP therapy well without any problems. The PAP download was reviewed today and showed an AHI of 5/hr on auto PAP with 57% compliance in using more than 4 hours nightly.  The patient has been using and benefiting from PAP use and  will continue to benefit from therapy. I have encouraged her to be more compliant with her device  2.  Morbid obesity -I have encouraged obesity to get into a routine exercise program and cut back on carbs and portions.    COVID-19 Education: The signs and symptoms of COVID-19 were discussed with the patient and how to seek care for testing (follow up with PCP or arrange E-visit).  The importance of social distancing was discussed today.  Patient Risk:   After full review of this patient's clinical status, I feel that they are at least moderate risk at this time.  Time:   Today, I have spent 20 minutes directly with the patient on telemedicine discussing medical problems including OSA, Obesity.  We also reviewed the symptoms of COVID 19 and the ways to protect against contracting the virus with telehealth technology.  I spent an additional 5 minutes reviewing patient's chart including PAP compliance download.  Medication Adjustments/Labs and Tests Ordered: Current medicines are reviewed at length with the patient today.  Concerns regarding medicines are outlined above.  Tests Ordered: No orders of the defined types were placed in this encounter.  Medication Changes: No orders of the defined types were placed in this encounter.   Disposition:  Follow up in 1  year(s)  Signed, Fransico Him, MD  08/08/2019 3:51 PM    [facility]

## 2019-08-10 ENCOUNTER — Encounter: Payer: BC Managed Care – PPO | Admitting: Physician Assistant

## 2019-08-10 ENCOUNTER — Other Ambulatory Visit: Payer: Self-pay

## 2019-08-10 DIAGNOSIS — L98412 Non-pressure chronic ulcer of buttock with fat layer exposed: Secondary | ICD-10-CM | POA: Diagnosis not present

## 2019-08-10 NOTE — Progress Notes (Addendum)
Adrienne [name] ([id number]) Visit Report for 08/10/2019 Chief Complaint Document Details Patient Name: Adrienne [name], [name] Date of Service: 08/10/2019 3:30 PM Medical Record Number: [id number] Patient Account Number: [id number] Date of Birth/Sex: April 08, 1971 (48 y.o. F) Treating RN: Rodell Perna Primary Care Provider: Antony Haste Other Clinician: Referring Provider: Antony Haste Treating Provider/Extender: Linwood Dibbles, Vuk Skillern Weeks in Treatment: 1 Information Obtained from: Patient Chief Complaint Left gluteal ulcer secondary to pilonidal cyst removal Electronic Signature(s) Signed: 08/10/2019 4:22:29 PM By: Lenda Kelp PA-C Entered By: Lenda Kelp on 08/10/2019 16:22:28 Broce, Jeannie L. ([id number]) -------------------------------------------------------------------------------- HPI Details Patient Name: [name], [name] Date of Service: 08/10/2019 3:30 PM Medical Record Number: [id number] Patient Account Number: [id number] Date of Birth/Sex: 1970/08/31 (48 y.o. F) Treating RN: Rodell Perna Primary Care Provider: Antony Haste Other Clinician: Referring Provider: Antony Haste Treating Provider/Extender: Linwood Dibbles, Nimrat Woolworth Weeks in Treatment: 1 History of Present Illness HPI Description: 08/03/2019 on evaluation today patient actually presents for evaluation concerning a left gluteal pilonidal cyst which occurred roughly 3 weeks ago. She was actually seen [facility] where they did remove the cyst based on what the patient tells me today. She also showed me the pictures. With that being said she tells me that since that time this has been showing some signs of improvement although unfortunately she still having a lot of discomfort at times it is just a very awkward spot. She does have a history of heart disease although she seems to be doing well in that regard at this point. There is no signs of active infection at this time. She tells me that they  did attempt to suture this closed once this was removed although based on what I am seeing it looks like that basically she just had to heal by second intent allowing granulation/scar tissue to fill in at this point. Again it looks like there was probably quite a bit of a cavity that was exposed and removed as a result of the procedure and again that somewhat difficult sometimes to suture back closed. 08/10/2019 on evaluation today patient actually appears to be doing better with regard to her wound. This is measuring smaller and overall is doing quite well. She is pleased with the progress is being made at this time. No fevers, chills, nausea, vomiting, or diarrhea. Electronic Signature(s) Signed: 08/10/2019 5:11:37 PM By: Lenda Kelp PA-C Entered By: Lenda Kelp on 08/10/2019 17:11:37 Zaffino, Macarthur Critchley ([id number]) -------------------------------------------------------------------------------- Physical Exam Details Patient Name: [name], [name] Date of Service: 08/10/2019 3:30 PM Medical Record Number: [id number] Patient Account Number: [id number] Date of Birth/Sex: [birth year] (48 y.o. F) Treating RN: Rodell Perna Primary Care Provider: Antony Haste Other Clinician: Referring Provider: Antony Haste Treating Provider/Extender: STONE III, Riva Sesma Weeks in Treatment: 1 Constitutional Well-nourished and well-hydrated in no acute distress. Respiratory normal breathing without difficulty. clear to auscultation bilaterally. Cardiovascular regular rate and rhythm with normal S1, S2. Psychiatric this patient is able to make decisions and demonstrates good insight into disease process. Alert and Oriented x 3. pleasant and cooperative. Notes Patient's wound bed currently showed signs of good granulation there was minimal slough noted on the surface wound which required some mechanical debridement but no sharp debridement was necessary today. This was excellent news as  the patient states that following debridement obviously it is much more tender and touchy for some time. Electronic Signature(s) Signed: 08/10/2019 5:12:09 PM By: Lenda Kelp PA-C Entered By: Lenda Kelp on 08/10/2019 17:12:08 Plante,  Annika L. ([id number]) -------------------------------------------------------------------------------- Physician Orders Details Patient Name: [name], [name] Date of Service: 08/10/2019 3:30 PM Medical Record Number: [id number] Patient Account Number: [id number] Date of Birth/Sex: [birth year] (48 y.o. F) Treating RN: Rodell PernaScott, Dajea Primary Care Provider: Antony HasteBADGER, MICHAEL Other Clinician: Referring Provider: Antony HasteBADGER, MICHAEL Treating Provider/Extender: Linwood DibblesSTONE III, Rudie Rikard Weeks in Treatment: 1 Verbal / Phone Orders: No Diagnosis Coding ICD-10 Coding Code Description L05.01 Pilonidal cyst with abscess L98.412 Non-pressure chronic ulcer of buttock with fat layer exposed I25.10 Atherosclerotic heart disease of native coronary artery without angina pectoris Wound Cleansing Wound #1 Left Gluteus o Clean wound with Normal Saline. o May Shower, gently pat wound dry prior to applying new dressing. Skin Barriers/Peri-Wound Care Wound #1 Left Gluteus o Skin Prep Primary Wound Dressing Wound #1 Left Gluteus o Silver Collagen Secondary Dressing Wound #1 Left Gluteus o Boardered Foam Dressing Dressing Change Frequency Wound #1 Left Gluteus o Change dressing every other day. Follow-up Appointments Wound #1 Left Gluteus o Return Appointment in 3 weeks. Electronic Signature(s) Signed: 08/10/2019 4:33:56 PM By: Rodell PernaScott, Dajea Signed: 08/11/2019 10:58:36 AM By: Lenda KelpStone III, Baer Hinton PA-C Entered By: Rodell PernaScott, Dajea on 08/10/2019 16:30:23 Scharfenberg, Melissia LMarland Kitchen. ([id number]) -------------------------------------------------------------------------------- Problem List Details Patient Name: [name], [name] Date of Service: 08/10/2019 3:30 PM Medical  Record Number: [id number] Patient Account Number: [id number] Date of Birth/Sex: [birth year] (48 y.o. F) Treating RN: Rodell PernaScott, Dajea Primary Care Provider: Antony HasteBADGER, MICHAEL Other Clinician: Referring Provider: Antony HasteBADGER, MICHAEL Treating Provider/Extender: Linwood DibblesSTONE III, Braxtyn Bojarski Weeks in Treatment: 1 Active Problems ICD-10 Evaluated Encounter Code Description Active Date Today Diagnosis L05.01 Pilonidal cyst with abscess 08/03/2019 No Yes L98.412 Non-pressure chronic ulcer of buttock with fat layer exposed 08/03/2019 No Yes I25.10 Atherosclerotic heart disease of native coronary artery 08/03/2019 No Yes without angina pectoris Inactive Problems Resolved Problems Electronic Signature(s) Signed: 08/10/2019 4:22:14 PM By: Lenda KelpStone III, Kinzly Pierrelouis PA-C Entered By: Lenda KelpStone III, Reya Aurich on 08/10/2019 16:22:13 Mccranie, Janan L. ([id number]) -------------------------------------------------------------------------------- Progress Note Details Patient Name: Zoll, Chealsey L. Date of Service: 08/10/2019 3:30 PM Medical Record Number: [id number] Patient Account Number: [id number] Date of Birth/Sex: [birth year] (48 y.o. F) Treating RN: Rodell PernaScott, Dajea Primary Care Provider: Antony HasteBADGER, MICHAEL Other Clinician: Referring Provider: Antony HasteBADGER, MICHAEL Treating Provider/Extender: Linwood DibblesSTONE III, Ayvion Kavanagh Weeks in Treatment: 1 Subjective Chief Complaint Information obtained from Patient Left gluteal ulcer secondary to pilonidal cyst removal History of Present Illness (HPI) 08/03/2019 on evaluation today patient actually presents for evaluation concerning a left gluteal pilonidal cyst which occurred roughly 3 weeks ago. [facility] was [facility] seen at [facility] where they did remove the cyst based on what the patient tells me today. She also showed me the pictures. With that being said she tells me that since that time this has been showing some signs of improvement although unfortunately she still having a lot of discomfort at times it  is just a very awkward spot. She does have a history of heart disease although she seems to be doing well in that regard at this point. There is no signs of active infection at this time. She tells me that they did attempt to suture this closed once this was removed although based on what I am seeing it looks like that basically she just had to heal by second intent allowing granulation/scar tissue to fill in at this point. Again it looks like there was probably quite a bit of a cavity that was exposed and removed as a result of the procedure and again that somewhat difficult sometimes to suture back  closed. 08/10/2019 on evaluation today patient actually appears to be doing better with regard to her wound. This is measuring smaller and overall is doing quite well. She is pleased with the progress is being made at this time. No fevers, chills, nausea, vomiting, or diarrhea. Patient History Information obtained from Patient. Family History Heart Disease - Mother, Hypertension - Mother, No family history of Cancer, Diabetes, Hereditary Spherocytosis, Kidney Disease, Lung Disease, Seizures, Stroke, Thyroid Problems, Tuberculosis. Social History Former smoker - vapes at times, Marital Status - Married, Alcohol Use - Rarely, Drug Use - No History, Caffeine Use - Daily. Medical History Respiratory Patient has history of Sleep Apnea Denies history of Aspiration, Asthma, Chronic Obstructive Pulmonary Disease (COPD), Pneumothorax, Tuberculosis Cardiovascular Patient has history of Coronary Artery Disease Denies history of Angina, Arrhythmia, Congestive Heart Failure, Deep Vein Thrombosis, Hypertension, Hypotension, Myocardial Infarction, Peripheral Arterial Disease, Peripheral Venous Disease, Phlebitis, Vasculitis Gastrointestinal Denies history of Cirrhosis , Colitis, Crohn s, Hepatitis A, Hepatitis B, Hepatitis C Integumentary (Skin) Denies history of History of Burn, History of pressure  wounds Medical And Surgical History Notes Cardiovascular Duclos, Amaurie L. ([id number]) hyperlipidemia Gastrointestinal GERD Integumentary (Skin) hx hidradenitis Review of Systems (ROS) Constitutional Symptoms (General Health) Denies complaints or symptoms of Fatigue, Fever, Chills, Marked Weight Change. Respiratory Denies complaints or symptoms of Chronic or frequent coughs, Shortness of Breath. Cardiovascular Denies complaints or symptoms of Chest pain, LE edema. Psychiatric Denies complaints or symptoms of Anxiety, Claustrophobia. Objective Constitutional Well-nourished and well-hydrated in no acute distress. Vitals Time Taken: 4:03 PM, Height: 64 in, Weight: 270 lbs, BMI: 46.3, Temperature: 98.7 F, Pulse: 59 bpm, Respiratory Rate: 16 breaths/min, Blood Pressure: 137/74 mmHg. Respiratory normal breathing without difficulty. clear to auscultation bilaterally. Cardiovascular regular rate and rhythm with normal S1, S2. Psychiatric this patient is able to make decisions and demonstrates good insight into disease process. Alert and Oriented x 3. pleasant and cooperative. General Notes: Patient's wound bed currently showed signs of good granulation there was minimal slough noted on the surface wound which required some mechanical debridement but no sharp debridement was necessary today. This was excellent news as the patient states that following debridement obviously it is much more tender and touchy for some time. Integumentary (Hair, Skin) Wound #1 status is Open. Original cause of wound was Bump. The wound is located on the Left Gluteus. The wound measures 1.4cm length x 1.9cm width x 0.3cm depth; 2.089cm^2 area and 0.627cm^3 volume. There is Fat Layer (Subcutaneous Tissue) Exposed exposed. There is no tunneling or undermining noted. There is a medium amount of serous drainage noted. The wound margin is flat and intact. There is large (67-100%) pink granulation within the  wound bed. There is a small (1-33%) amount of necrotic tissue within the wound bed including Adherent Slough. Bohlen, Alicia L. 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 May Shower, gently pat wound dry prior to applying new dressing. Skin Barriers/Peri-Wound Care: Wound #1 Left Gluteus: Skin Prep Primary Wound Dressing: Wound #1 Left Gluteus: Silver Collagen Secondary Dressing: Wound #1 Left Gluteus: Boardered Foam Dressing Dressing Change Frequency: Wound #1 Left Gluteus: Change dressing every other day. Follow-up Appointments: Wound #1 Left Gluteus: Return Appointment in 3 weeks. 1. At this time is good to be that we go ahead and continue with the current wound care  measures which includes the silver collagen dressing. She will continue to cover this with a border foam dressing which seems to be doing well for her. 2. I would recommend as well that she continue with appropriate offloading she is moving around and using a pillow as well for offloading purposes and that seems to be doing well. We will see patient back for reevaluation in 3 weeks here in the clinic. If anything worsens or changes patient will contact our office for additional recommendations. Electronic Signature(s) Signed: 08/10/2019 5:12:35 PM By: Worthy Keeler PA-C Entered By: Worthy Keeler on 08/10/2019 17:12:35 Polson, Yailine LMarland Kitchen ([id number]) -------------------------------------------------------------------------------- ROS/PFSH Details Patient Name: Vavrek, Opha L. Date of Service: 08/10/2019 3:30 PM Medical Record Number: [id number] Patient Account Number: [id number] Date of Birth/Sex: [birth year] (48 y.o. F) Treating RN: Army Melia Primary  Care Provider: Anastasia Pall Other Clinician: Referring Provider: Anastasia Pall Treating Provider/Extender: Melburn Hake, Tinsley Everman Weeks in Treatment: 1 Information Obtained From Patient Constitutional Symptoms (General Health) Complaints and Symptoms: Negative for: Fatigue; Fever; Chills; Marked Weight Change Respiratory Complaints and Symptoms: Negative for: Chronic or frequent coughs; Shortness of Breath Medical History: Positive for: Sleep Apnea Negative for: Aspiration; Asthma; Chronic Obstructive Pulmonary Disease (COPD); Pneumothorax; Tuberculosis Cardiovascular Complaints and Symptoms: Negative for: Chest pain; LE edema Medical History: Positive for: Coronary Artery Disease Negative for: Angina; Arrhythmia; Congestive Heart Failure; Deep Vein Thrombosis; Hypertension; Hypotension; Myocardial Infarction; Peripheral Arterial Disease; Peripheral Venous Disease; Phlebitis; Vasculitis Past Medical History Notes: hyperlipidemia Psychiatric Complaints and Symptoms: Negative for: Anxiety; Claustrophobia Gastrointestinal Medical History: Negative for: Cirrhosis ; Colitis; Crohnos; Hepatitis A; Hepatitis B; Hepatitis C Past Medical History Notes: GERD Integumentary (Skin) Medical History: Negative for: History of Burn; History of pressure wounds Past Medical History Notes: hx hidradenitis Immunizations Pneumococcal Vaccine: Nickless, Prestyn L. ([id number]) Received Pneumococcal Vaccination: No Implantable Devices None Family and Social History Cancer: No; Diabetes: No; Heart Disease: Yes - Mother; Hereditary Spherocytosis: No; Hypertension: Yes - Mother; Kidney Disease: No; Lung Disease: No; Seizures: No; Stroke: No; Thyroid Problems: No; Tuberculosis: No; Former smoker - vapes at times; Marital Status - Married; Alcohol Use: Rarely; Drug Use: No History; Caffeine Use: Daily; Financial Concerns: No; Food, Clothing or Shelter Needs: No; Support System Lacking: No;  Transportation Concerns: No Physician Affirmation I have reviewed and agree with the above information. Electronic Signature(s) Signed: 08/11/2019 10:58:36 AM By: Worthy Keeler PA-C Signed: 08/11/2019 11:37:18 AM By: Army Melia Entered By: Worthy Keeler on 08/10/2019 17:11:52 Michelotti, Keydi L. ([id number]) -------------------------------------------------------------------------------- SuperBill Details Patient Name: Glance, Mali L. Date of Service: 08/10/2019 Medical Record Number: [id number] Patient Account Number: [id number] Date of Birth/Sex: Mar 06, 1971 (48 y.o. F) Treating RN: Army Melia Primary Care Provider: Anastasia Pall Other Clinician: Referring Provider: Anastasia Pall Treating Provider/Extender: Melburn Hake, Yuniel Blaney Weeks in Treatment: 1 Diagnosis Coding ICD-10 Codes Code Description L05.01 Pilonidal cyst with abscess L98.412 Non-pressure chronic ulcer of buttock with fat layer exposed I25.10 Atherosclerotic heart disease of native coronary artery without angina pectoris Facility Procedures CPT4 Code: [id number] Description: 99213 - WOUND CARE VISIT-LEV 3 EST PT Modifier: Quantity: 1 Physician Procedures CPT4 Code Description: [id number] 99214 - WC PHYS LEVEL 4 - EST PT ICD-10 Diagnosis Description L05.01 Pilonidal cyst with abscess L98.412 Non-pressure chronic ulcer of buttock with fat layer exposed I25.10 Atherosclerotic heart disease of native coronary  artery witho Modifier: ut angina pectori Quantity: 1 s Electronic Signature(s) Signed: 08/10/2019 5:12:45 PM By: Worthy Keeler PA-C Entered By: Worthy Keeler on 08/10/2019 17:12:45

## 2019-08-10 NOTE — Progress Notes (Addendum)
Adrienne [name] ([id number]) Visit Report for 08/10/2019 Arrival Information Details Patient Name: [name], [name] Date of Service: 08/10/2019 3:30 PM Medical Record Number: [id number] Patient Account Number: [id number] Date of Birth/Sex: [birth year] (48 y.o. F) Treating RN: Montey Hora Primary Care Adrienne [name] Other Clinician: Referring Melissa Tomaselli: Adrienne [name] Treating Isaic Syler/Extender: Melburn Hake, HOYT Weeks in Treatment: 1 Visit Information History Since Last Visit Added or deleted any medications: No Patient Arrived: Ambulatory Any new allergies or adverse reactions: No Arrival Time: 16:02 Had a fall or experienced change in No Accompanied By: self activities of daily living that may affect Transfer Assistance: None risk of falls: Patient Identification Verified: Yes Signs or symptoms of abuse/neglect since last visito No Secondary Verification Process Yes Hospitalized since last visit: No Completed: Implantable device outside of the clinic excluding No Patient Has Alerts: Yes cellular tissue based products placed in the center Patient Alerts: Patient on Blood since last visit: Thinner Has Dressing in Place as Prescribed: Yes aspirin 81 Pain Present Now: Yes Electronic Signature(s) Signed: 08/10/2019 4:20:49 PM By: Montey Hora Entered By: Montey Hora on 08/10/2019 16:03:27 Chalmers, Taraya L. ([id number]) -------------------------------------------------------------------------------- Clinic Level of Care Assessment Details Patient Name: [name], [name] Date of Service: 08/10/2019 3:30 PM Medical Record Number: [id number] Patient Account Number: [id number] Date of Birth/Sex: 29-May-1971 (48 y.o. F) Treating RN: Army Melia Primary Care Shawna Kiener: Adrienne [name] Other Clinician: Referring Morning Halberg: Adrienne [name] Treating Sharni Negron/Extender: Melburn Hake, HOYT Weeks in Treatment: 1 Clinic Level of Care Assessment Items TOOL 4 Quantity  Score []  - Use when only an EandM is performed on FOLLOW-UP visit 0 ASSESSMENTS - Nursing Assessment / Reassessment X - Reassessment of Co-morbidities (includes updates in patient status) 1 10 X- 1 5 Reassessment of Adherence to Treatment Plan ASSESSMENTS - Wound and Skin Assessment / Reassessment X - Simple Wound Assessment / Reassessment - one wound 1 5 []  - 0 Complex Wound Assessment / Reassessment - multiple wounds []  - 0 Dermatologic / Skin Assessment (not related to wound area) ASSESSMENTS - Focused Assessment []  - Circumferential Edema Measurements - multi extremities 0 []  - 0 Nutritional Assessment / Counseling / Intervention []  - 0 Lower Extremity Assessment (monofilament, tuning fork, pulses) []  - 0 Peripheral Arterial Disease Assessment (using hand held doppler) ASSESSMENTS - Ostomy and/or Continence Assessment and Care []  - Incontinence Assessment and Management 0 []  - 0 Ostomy Care Assessment and Management (repouching, etc.) PROCESS - Coordination of Care X - Simple Patient / Family Education for ongoing care 1 15 []  - 0 Complex (extensive) Patient / Family Education for ongoing care []  - 0 Staff obtains Programmer, systems, Records, Test Results / Process Orders []  - 0 Staff telephones HHA, Nursing Homes / Clarify orders / etc []  - 0 Routine Transfer to another Facility (non-emergent condition) []  - 0 Routine Hospital Admission (non-emergent condition) []  - 0 New Admissions / Biomedical engineer / Ordering NPWT, Apligraf, etc. []  - 0 Emergency Hospital Admission (emergent condition) X- 1 10 Simple Discharge Coordination [name], [name] ([id number]) []  - 0 Complex (extensive) Discharge Coordination PROCESS - Special Needs []  - Pediatric / Minor Patient Management 0 []  - 0 Isolation Patient Management []  - 0 Hearing / Language / Visual special needs []  - 0 Assessment of Community assistance (transportation, D/C planning, etc.) []  - 0 Additional  assistance / Altered mentation []  - 0 Support Surface(s) Assessment (bed, cushion, seat, etc.) INTERVENTIONS - Wound Cleansing / Measurement X - Simple Wound Cleansing - one wound 1 5 []  -  0 Complex Wound Cleansing - multiple wounds X- 1 5 Wound Imaging (photographs - any number of wounds)  - 0 Wound Tracing (instead of photographs) X- 1 5 Simple Wound Measurement - one wound  - 0 Complex Wound Measurement - multiple wounds INTERVENTIONS - Wound Dressings  - Small Wound Dressing one or multiple wounds 0 X- 1 15 Medium Wound Dressing one or multiple wounds  - 0 Large Wound Dressing one or multiple wounds  - 0 Application of Medications - topical  - 0 Application of Medications - injection INTERVENTIONS - Miscellaneous  - External ear exam 0  - 0 Specimen Collection (cultures, biopsies, blood, body fluids, etc.)  - 0 Specimen(s) / Culture(s) sent or taken to Lab for analysis  - 0 Patient Transfer (multiple staff / Nurse, adult / Similar devices)  - 0 Simple Staple / Suture removal (25 or less)  - 0 Complex Staple / Suture removal (26 or more)  - 0 Hypo / Hyperglycemic Management (close monitor of Blood Glucose)  - 0 Ankle / Brachial Index (ABI) - do not check if billed separately X- 1 5 Vital Signs [name], [name] ([id number]) Has the patient been seen at the hospital within the last three years: Yes Total Score: 80 Level Of Care: New/Established - Level 3 Electronic Signature(s) Signed: 08/10/2019 4:33:56 PM By: Rodell Perna Entered By: Rodell Perna on 08/10/2019 16:30:46 [name], [name] ([id number]) -------------------------------------------------------------------------------- Encounter Discharge Information Details Patient Name: [name], [name] Date of Service: 08/10/2019 3:30 PM Medical Record Number: [id number] Patient Account Number: [id number] Date of Birth/Sex: Sep 21, 1970 (48 y.o. F) Treating RN: Rodell Perna Primary  Care Trellis Guirguis: Antony Haste Other Clinician: Referring Emberlyn Burlison: Antony Haste Treating Madeline Pho/Extender: Linwood Dibbles, HOYT Weeks in Treatment: 1 Encounter Discharge Information Items Discharge Condition: Stable Ambulatory Status: Ambulatory Discharge Destination: Home Transportation: Private Auto Accompanied By: self Schedule Follow-up Appointment: Yes Clinical Summary of Care: Electronic Signature(s) Signed: 08/10/2019 4:33:56 PM By: Rodell Perna Entered By: Rodell Perna on 08/10/2019 16:31:35 Crayton, Brittiny L. ([id number]) -------------------------------------------------------------------------------- Lower Extremity Assessment Details Patient Name: [name], [name] Date of Service: 08/10/2019 3:30 PM Medical Record Number: [id number] Patient Account Number: [id number] Date of Birth/Sex: Aug 16, 1971 (48 y.o. F) Treating RN: Curtis Sites Primary Care Amayrany Cafaro: Antony Haste Other Clinician: Referring Breeley Bischof: Antony Haste Treating Michaeleen Down/Extender: Linwood Dibbles, HOYT Weeks in Treatment: 1 Electronic Signature(s) Signed: 08/10/2019 4:20:49 PM By: Curtis Sites Entered By: Curtis Sites on 08/10/2019 16:05:26 [name], [name] ([id number]) -------------------------------------------------------------------------------- Multi Wound Chart Details Patient Name: [name], [name] Date of Service: 08/10/2019 3:30 PM Medical Record Number: [id number] Patient Account Number: [id number] Date of Birth/Sex: 15-Jan-1971 (48 y.o. F) Treating RN: Rodell Perna Primary Care Aayla Marrocco: Antony Haste Other Clinician: Referring Leonid Manus: Antony Haste Treating Lynette Topete/Extender: STONE III, HOYT Weeks in Treatment: 1 Vital Signs Height(in): 64 Pulse(bpm): 59 Weight(lbs): 270 Blood Pressure(mmHg): 137/74 Body Mass Index(BMI): 46 Temperature(F): 98.7 Respiratory Rate 16 (breaths/min): Photos: [N/A:N/A] Wound Location: Left Gluteus N/A N/A Wounding Event: Bump N/A  N/A Primary Etiology: Pilonidal Cyst N/A N/A Comorbid History: Sleep Apnea, Coronary Artery N/A N/A Disease Date Acquired: 07/19/2019 N/A N/A Weeks of Treatment: 1 N/A N/A Wound Status: Open N/A N/A Measurements L x W x D 1.4x1.9x0.3 N/A N/A (cm) Area (cm) : 2.089 N/A N/A Volume (cm) : 0.627 N/A N/A % Reduction in Area: 48.20% N/A N/A % Reduction in Volume: 48.10% N/A N/A Classification: Full Thickness Without N/A N/A Exposed Support Structures Exudate Amount: Medium N/A N/A Exudate Type: Serous N/A N/A Exudate Color: amber N/A  N/A Wound Margin: Flat and Intact N/A N/A Granulation Amount: Large (67-100%) N/A N/A Granulation Quality: Pink N/A N/A Necrotic Amount: Small (1-33%) N/A N/A Exposed Structures: Fat Layer (Subcutaneous N/A N/A Tissue) Exposed: Yes Fascia: No Tendon: No Muscle: No Hartog, Komal L. ([id number]) Joint: No Bone: No Epithelialization: None N/A N/A Treatment Notes Electronic Signature(s) Signed: 08/10/2019 4:33:56 PM By: Rodell Perna Entered By: Rodell Perna on 08/10/2019 16:24:40 Lanes, Gwyndolyn LMarland Kitchen ([id number]) -------------------------------------------------------------------------------- Multi-Disciplinary Care Plan Details Patient Name: [name], [name] Date of Service: 08/10/2019 3:30 PM Medical Record Number: [id number] Patient Account Number: [id number] Date of Birth/Sex: 1970/12/27 (48 y.o. F) Treating RN: Rodell Perna Primary Care Demetri Kerman: Antony Haste Other Clinician: Referring Kaipo Ardis: Antony Haste Treating Lavonna Lampron/Extender: Linwood Dibbles, HOYT Weeks in Treatment: 1 Active Inactive Orientation to the Wound Care Program Nursing Diagnoses: Knowledge deficit related to the wound healing center program Goals: Patient/caregiver will verbalize understanding of the Wound Healing Center Program Date Initiated: 08/03/2019 Target Resolution Date: 09/01/2019 Goal Status: Active Interventions: Provide education on orientation to the  wound center Notes: Pain, Acute or Chronic Nursing Diagnoses: Potential alteration in comfort, pain Goals: Patient/caregiver will verbalize adequate pain control between visits Date Initiated: 08/03/2019 Target Resolution Date: 09/01/2019 Goal Status: Active Interventions: Assess comfort goal upon admission Notes: Wound/Skin Impairment Nursing Diagnoses: Impaired tissue integrity Goals: Ulcer/skin breakdown will have a volume reduction of 30% by week 4 Date Initiated: 08/03/2019 Target Resolution Date: 08/30/2019 Goal Status: Active Interventions: Assess ulceration(s) every visit KELLIANNE, EK ([id number]) Notes: Electronic Signature(s) Signed: 08/10/2019 4:33:56 PM By: Rodell Perna Entered By: Rodell Perna on 08/10/2019 16:24:31 Keator, Daniel L. ([id number]) -------------------------------------------------------------------------------- Pain Assessment Details Patient Name: [name], [name] Date of Service: 08/10/2019 3:30 PM Medical Record Number: [id number] Patient Account Number: [id number] Date of Birth/Sex: [birth year] (47 y.o. F) Treating RN: Curtis Sites Primary Care Chanya Chrisley: Antony Haste Other Clinician: Referring Bassem Bernasconi: Antony Haste Treating Luis Nickles/Extender: Linwood Dibbles, HOYT Weeks in Treatment: 1 Active Problems Location of Pain Severity and Description of Pain Patient Has Paino Yes Site Locations Pain Location: Pain in Ulcers With Dressing Change: Yes Pain Management and Medication Current Pain Management: Electronic Signature(s) Signed: 08/10/2019 4:20:49 PM By: Curtis Sites Entered By: Curtis Sites on 08/10/2019 16:03:40 Hulsebus, Chudney Elbert Ewings ([id number]) -------------------------------------------------------------------------------- Patient/Caregiver Education Details Patient Name: [name], [name] Date of Service: 08/10/2019 3:30 PM Medical Record Number: [id number] Patient Account Number: [id number] Date of Birth/Gender:  January 23, 1971 (48 y.o. F) Treating RN: Rodell Perna Primary Care Physician: Antony Haste Other Clinician: Referring Physician: Antony Haste Treating Physician/Extender: Skeet Simmer in Treatment: 1 Education Assessment Education Provided To: Patient Education Topics Provided Wound/Skin Impairment: Handouts: Caring for Your Ulcer Methods: Demonstration, Explain/Verbal Responses: State content correctly Electronic Signature(s) Signed: 08/10/2019 4:33:56 PM By: Rodell Perna Entered By: Rodell Perna on 08/10/2019 16:30:57 [name], [name] ([id number]) -------------------------------------------------------------------------------- Wound Assessment Details Patient Name: Kamath, Annagrace L. Date of Service: 08/10/2019 3:30 PM Medical Record Number: [id number] Patient Account Number: [id number] Date of Birth/Sex: 25-Dec-1970 (48 y.o. F) Treating RN: Curtis Sites Primary Care Jalisha Enneking: Antony Haste Other Clinician: Referring Tirza Senteno: Antony Haste Treating Moise Friday/Extender: STONE III, HOYT Weeks in Treatment: 1 Wound Status Wound Number: 1 Primary Etiology: Pilonidal Cyst Wound Location: Left Gluteus Wound Status: Open Wounding Event: Bump Comorbid History: Sleep Apnea, Coronary Artery Disease Date Acquired: 07/19/2019 Weeks Of Treatment: 1 Clustered Wound: No Photos Wound Measurements Length: (cm) 1.4 Width: (cm) 1.9 Depth: (cm) 0.3 Area: (cm) 2.089 Volume: (cm) 0.627 % Reduction in Area: 48.2% % Reduction in Volume: 48.1%  Epithelialization: None Tunneling: No Undermining: No Wound Description Full Thickness Without Exposed Support Foul Odo Classification: Structures Slough/F Wound Margin: Flat and Intact Exudate Medium Amount: Exudate Type: Serous Exudate Color: amber r After Cleansing: No ibrino Yes Wound Bed Granulation Amount: Large (67-100%) Exposed Structure Granulation Quality: Pink Fascia Exposed: No Necrotic Amount: Small  (1-33%) Fat Layer (Subcutaneous Tissue) Exposed: Yes Necrotic Quality: Adherent Slough Tendon Exposed: No Muscle Exposed: No Joint Exposed: No Bone Exposed: No Strauch, Kristyl L. ([id number]) Treatment Notes Wound #1 (Left Gluteus) Notes prisma, BFD Electronic Signature(s) Signed: 08/10/2019 4:20:49 PM By: Curtis Sitesorthy, Joanna Entered By: Curtis Sitesorthy, Joanna on 08/10/2019 16:09:20 Theard, Chelsa L. ([id number]) -------------------------------------------------------------------------------- Vitals Details Patient Name: Nham, Myah L. Date of Service: 08/10/2019 3:30 PM Medical Record Number: [id number] Patient Account Number: [id number] Date of Birth/Sex: 15-Nov-1970 (48 y.o. F) Treating RN: Curtis Sitesorthy, Joanna Primary Care Jaman Aro: Antony HasteBADGER, MICHAEL Other Clinician: Referring Kensley Lares: Antony HasteBADGER, MICHAEL Treating Clarice Bonaventure/Extender: Linwood DibblesSTONE III, HOYT Weeks in Treatment: 1 Vital Signs Time Taken: 16:03 Temperature (F): 98.7 Height (in): 64 Pulse (bpm): 59 Weight (lbs): 270 Respiratory Rate (breaths/min): 16 Body Mass Index (BMI): 46.3 Blood Pressure (mmHg): 137/74 Reference Range: 80 - 120 mg / dl Electronic Signature(s) Signed: 08/10/2019 4:20:49 PM By: Curtis Sitesorthy, Joanna Entered By: Curtis Sitesorthy, Joanna on 08/10/2019 16:05:03

## 2019-08-14 ENCOUNTER — Other Ambulatory Visit: Payer: BC Managed Care – PPO

## 2019-08-17 ENCOUNTER — Ambulatory Visit: Payer: BC Managed Care – PPO | Admitting: Physician Assistant

## 2019-08-23 ENCOUNTER — Telehealth: Payer: Self-pay | Admitting: Pharmacist

## 2019-08-23 NOTE — Telephone Encounter (Signed)
Called pt and left message - need to reschedule lipid panel to assess efficacy of Repatha injections. If f/u triglycerides are > 135, will try resubmitting for Vascepa coverage as well.

## 2019-08-31 ENCOUNTER — Encounter: Payer: BC Managed Care – PPO | Attending: Physician Assistant | Admitting: Physician Assistant

## 2019-08-31 ENCOUNTER — Other Ambulatory Visit: Payer: Self-pay

## 2019-08-31 DIAGNOSIS — L98412 Non-pressure chronic ulcer of buttock with fat layer exposed: Secondary | ICD-10-CM | POA: Diagnosis not present

## 2019-08-31 DIAGNOSIS — I2510 Atherosclerotic heart disease of native coronary artery without angina pectoris: Secondary | ICD-10-CM | POA: Diagnosis not present

## 2019-08-31 NOTE — Progress Notes (Addendum)
BELISSA, KOOY ([id number]) Visit Report for 08/31/2019 Arrival Information Details Patient Name: LIPUMA, Mellonie L. Date of Service: 08/31/2019 1:45 PM Medical Record Number: [id number] Patient Account Number: [id number] Date of Birth/Sex: [birth year] (49 y.o. F) Treating RN: Army Melia Primary Care Brysen Shankman: Anastasia Pall Other Clinician: Referring Lekesha Claw: Anastasia Pall Treating Daionna Crossland/Extender: Melburn Hake, HOYT Weeks in Treatment: 4 Visit Information History Since Last Visit Added or deleted any medications: No Patient Arrived: Ambulatory Any new allergies or adverse reactions: No Arrival Time: 14:16 Had a fall or experienced change in No Accompanied By: self activities of daily living that may affect Transfer Assistance: None risk of falls: Patient Identification Verified: Yes Signs or symptoms of abuse/neglect since last visito No Secondary Verification Process Yes Hospitalized since last visit: No Completed: Implantable device outside of the clinic excluding No Patient Has Alerts: Yes cellular tissue based products placed in the center Patient Alerts: Patient on Blood since last visit: Thinner Has Dressing in Place as Prescribed: No aspirin 81 Pain Present Now: No Electronic Signature(s) Signed: 08/31/2019 4:13:51 PM By: Lorine Bears RCP, RRT, CHT Entered By: Lorine Bears on 08/31/2019 14:17:47 Sandhu, Awilda L. ([id number]) -------------------------------------------------------------------------------- Clinic Level of Care Assessment Details Patient Name: Albritton, Markeita L. Date of Service: 08/31/2019 1:45 PM Medical Record Number: [id number] Patient Account Number: [id number] Date of Birth/Sex: 1971/01/08 (49 y.o. F) Treating RN: Army Melia Primary Care Miken Stecher: Anastasia Pall Other Clinician: Referring Sahiba Granholm: Anastasia Pall Treating Khyson Sebesta/Extender: Melburn Hake, HOYT Weeks in Treatment: 4 Clinic Level of Care  Assessment Items TOOL 4 Quantity Score []  - Use when only an EandM is performed on FOLLOW-UP visit 0 ASSESSMENTS - Nursing Assessment / Reassessment X - Reassessment of Co-morbidities (includes updates in patient status) 1 10 X- 1 5 Reassessment of Adherence to Treatment Plan ASSESSMENTS - Wound and Skin Assessment / Reassessment X - Simple Wound Assessment / Reassessment - one wound 1 5 []  - 0 Complex Wound Assessment / Reassessment - multiple wounds []  - 0 Dermatologic / Skin Assessment (not related to wound area) ASSESSMENTS - Focused Assessment []  - Circumferential Edema Measurements - multi extremities 0 []  - 0 Nutritional Assessment / Counseling / Intervention []  - 0 Lower Extremity Assessment (monofilament, tuning fork, pulses) []  - 0 Peripheral Arterial Disease Assessment (using hand held doppler) ASSESSMENTS - Ostomy and/or Continence Assessment and Care []  - Incontinence Assessment and Management 0 []  - 0 Ostomy Care Assessment and Management (repouching, etc.) PROCESS - Coordination of Care X - Simple Patient / Family Education for ongoing care 1 15 []  - 0 Complex (extensive) Patient / Family Education for ongoing care X- 1 10 Staff obtains Programmer, systems, Records, Test Results / Process Orders []  - 0 Staff telephones HHA, Nursing Homes / Clarify orders / etc []  - 0 Routine Transfer to another Facility (non-emergent condition) []  - 0 Routine Hospital Admission (non-emergent condition) []  - 0 New Admissions / Biomedical engineer / Ordering NPWT, Apligraf, etc. []  - 0 Emergency Hospital Admission (emergent condition) X- 1 10 Simple Discharge Coordination Prosser, Aemilia L. ([id number]) []  - 0 Complex (extensive) Discharge Coordination PROCESS - Special Needs []  - Pediatric / Minor Patient Management 0 []  - 0 Isolation Patient Management []  - 0 Hearing / Language / Visual special needs []  - 0 Assessment of Community assistance (transportation, D/C planning,  etc.) []  - 0 Additional assistance / Altered mentation X- 1 15 Support Surface(s) Assessment (bed, cushion, seat, etc.) INTERVENTIONS - Wound Cleansing / Measurement X - Simple Wound Cleansing - one  wound 1 5 []  - 0 Complex Wound Cleansing - multiple wounds X- 1 5 Wound Imaging (photographs - any number of wounds) []  - 0 Wound Tracing (instead of photographs) X- 1 5 Simple Wound Measurement - one wound []  - 0 Complex Wound Measurement - multiple wounds INTERVENTIONS - Wound Dressings []  - Small Wound Dressing one or multiple wounds 0 []  - 0 Medium Wound Dressing one or multiple wounds []  - 0 Large Wound Dressing one or multiple wounds []  - 0 Application of Medications - topical []  - 0 Application of Medications - injection INTERVENTIONS - Miscellaneous []  - External ear exam 0 []  - 0 Specimen Collection (cultures, biopsies, blood, body fluids, etc.) []  - 0 Specimen(s) / Culture(s) sent or taken to Lab for analysis []  - 0 Patient Transfer (multiple staff / / Similar devices) []  - 0 Simple Staple / Suture removal (25 or less) []  - 0 Complex Staple / Suture removal (26 or more) []  - 0 Hypo / Hyperglycemic Management (close monitor of Blood Glucose) []  - 0 Ankle / Brachial Index (ABI) - do not check if billed separately X- 1 5 Vital Signs Tiller, Rea L. ( ) Has the patient been seen at the hospital within the last three years: Yes Total Score: 90 Level Of Care: New/Established - Level 3 Electronic Signature(s) Signed: 08/31/2019 5:01:42 PM By: Entered By: on 08/31/2019 15:01:34 Kutscher, Tona L. ( ) -------------------------------------------------------------------------------- Encounter Discharge Information Details Patient Name: Essex, Oaklee L. Date of Service: 08/31/2019 1:45 PM Medical Record Number: Patient Account Number: Date of Birth/Sex: 06-13-1971 (49 y.o. F) Treating RN:  Nurse, adult Primary Care Aleecia Tapia: Other Clinician: Referring Lisbet Busker: Treating Cailean Heacock/Extender: , HOYT Weeks in Treatment: 4 Encounter Discharge Information Items Discharge Condition: Stable Ambulatory Status: Ambulatory Discharge Destination: Home Transportation: Private Auto Accompanied By: self Schedule Follow-up Appointment: Yes Clinical Summary of Care: Electronic Signature(s) Signed: 08/31/2019 5:01:42 PM By: [id number] Entered By: 10/29/2019 on 08/31/2019 15:02:01 Kaluzny, Ramandeep L. (Rodell Perna) -------------------------------------------------------------------------------- Lower Extremity Assessment Details Patient Name: Edmonston, Shelle L. Date of Service: 08/31/2019 1:45 PM Medical Record Number: [id number] Patient Account Number: 10/29/2019 Date of Birth/Sex: [birth year] (49 y.o. F) Treating RN: 09/08/1970 Primary Care Katrena Stehlin: 52 Other Clinician: Referring Akbar Sacra: Rodell Perna Treating Jaclyn Andy/Extender: Antony Haste, HOYT Weeks in Treatment: 4 Electronic Signature(s) Signed: 08/31/2019 4:40:20 PM By: Linwood Dibbles Entered By: 10/29/2019 on 08/31/2019 14:29:10 Anne, Rether L. (Rodell Perna) -------------------------------------------------------------------------------- Multi Wound Chart Details Patient Name: Gillham, Artie L. Date of Service: 08/31/2019 1:45 PM Medical Record Number: [id number] Patient Account Number: 10/29/2019 Date of Birth/Sex: 1971-02-09 (49 y.o. F) Treating RN: 09/08/1970 Primary Care Glendon Fiser: 52 Other Clinician: Referring Neida Ellegood: Curtis Sites Treating Zekiel Torian/Extender: STONE III, HOYT Weeks in Treatment: 4 Vital Signs Height(in): 64 Pulse(bpm): 59 Weight(lbs): 270 Blood Pressure(mmHg): 119/61 Body Mass Index(BMI): 46 Temperature(F): 98.7 Respiratory Rate 16 (breaths/min): Photos: [N/A:N/A] Wound Location: Left Gluteus N/A N/A Wounding  Event: Bump N/A N/A Primary Etiology: Pilonidal Cyst N/A N/A Comorbid History: Sleep Apnea, Coronary Artery N/A N/A Disease Date Acquired: 07/19/2019 N/A N/A Weeks of Treatment: 4 N/A N/A Wound Status: Open N/A N/A Measurements L x W x D 0.1x0.5x0.2 N/A N/A (cm) Area (cm) : 0.039 N/A N/A Volume (cm) : 0.008 N/A N/A % Reduction in Area: 99.00% N/A N/A % Reduction in Volume: 99.30% N/A N/A Classification: Full Thickness Without N/A N/A Exposed Support Structures Exudate Amount: None Present N/A N/A Wound Margin: Flat  and Intact N/A N/A Granulation Amount: Large (67-100%) N/A N/A Granulation Quality: Pink N/A N/A Necrotic Amount: None Present (0%) N/A N/A Exposed Structures: Fat Layer (Subcutaneous N/A N/A Tissue) Exposed: Yes Fascia: No Tendon: No Muscle: No Joint: No Bone: No Epithelialization: Medium (34-66%) N/A N/A Landau, Jerilynn L. ([id number]) Treatment Notes Electronic Signature(s) Signed: 08/31/2019 5:01:42 PM By: Rodell Perna Entered By: Rodell Perna on 08/31/2019 14:55:29 Storti, Asianna Elbert Ewings ([id number]) -------------------------------------------------------------------------------- Multi-Disciplinary Care Plan Details Patient Name: Capizzi, Madelline L. Date of Service: 08/31/2019 1:45 PM Medical Record Number: [id number] Patient Account Number: [id number] Date of Birth/Sex: 10/19/1970 (49 y.o. F) Treating RN: Rodell Perna Primary Care Lolamae Voisin: Antony Haste Other Clinician: Referring Leeam Cedrone: Antony Haste Treating Kayon Dozier/Extender: Linwood Dibbles, HOYT Weeks in Treatment: 4 Active Inactive Electronic Signature(s) Signed: 09/01/2019 10:21:47 AM By: Elliot Gurney, BSN, RN, CWS, Kim RN, BSN Signed: 09/01/2019 1:23:06 PM By: Rodell Perna Previous Signature: 08/31/2019 5:01:42 PM Version By: Rodell Perna Entered By: Elliot Gurney BSN, RN, CWS, Kim on 09/01/2019 10:21:47 Hassing, Macarthur Critchley  ([id number]) -------------------------------------------------------------------------------- Pain Assessment Details Patient Name: Zipper, Thomasa L. Date of Service: 08/31/2019 1:45 PM Medical Record Number: [id number] Patient Account Number: [id number] Date of Birth/Sex: 1970-09-04 (49 y.o. F) Treating RN: Rodell Perna Primary Care Chanz Cahall: Antony Haste Other Clinician: Referring Ezana Hubbert: Antony Haste Treating Dorrien Grunder/Extender: Linwood Dibbles, HOYT Weeks in Treatment: 4 Active Problems Location of Pain Severity and Description of Pain Patient Has Paino No Site Locations Pain Management and Medication Current Pain Management: Electronic Signature(s) Signed: 08/31/2019 4:13:51 PM By: Sallee Provencal, RRT, CHT Signed: 08/31/2019 5:01:42 PM By: Rodell Perna Entered By: Dayton Martes on 08/31/2019 14:17:59 [name], Fatmata L. ([id number]) -------------------------------------------------------------------------------- Patient/Caregiver Education Details Patient Name: Coppedge, Mekenna L. Date of Service: 08/31/2019 1:45 PM Medical Record Number: [id number] Patient Account Number: [id number] Date of Birth/Gender: Oct 17, 1970 (49 y.o. F) Treating RN: Rodell Perna Primary Care Physician: Antony Haste Other Clinician: Referring Physician: Antony Haste Treating Physician/Extender: Skeet Simmer in Treatment: 4 Education Assessment Education Provided To: Patient Education Topics Provided Wound/Skin Impairment: Handouts: Caring for Your Ulcer Methods: Demonstration, Explain/Verbal Responses: State content correctly Electronic Signature(s) Signed: 08/31/2019 5:01:42 PM By: Rodell Perna Entered By: Rodell Perna on 08/31/2019 15:01:45 Mandella, Elowen L. ([id number]) -------------------------------------------------------------------------------- Wound Assessment Details Patient Name: Deprey, Darlis L. Date of Service: 08/31/2019 1:45 PM Medical  Record Number: [id number] Patient Account Number: [id number] Date of Birth/Sex: [birth year] (49 y.o. F) Treating RN: Rodell Perna Primary Care Avice Funchess: Antony Haste Other Clinician: Referring Ibraheem Voris: Antony Haste Treating Travious Vanover/Extender: STONE III, HOYT Weeks in Treatment: 4 Wound Status Wound Number: 1 Primary Etiology: Pilonidal Cyst Wound Location: Left Gluteus Wound Status: Healed - Epithelialized Wounding Event: Bump Comorbid History: Sleep Apnea, Coronary Artery Disease Date Acquired: 07/19/2019 Weeks Of Treatment: 4 Clustered Wound: No Photos Wound Measurements Length: (cm) 0 % Reducti Width: (cm) 0 % Reducti Depth: (cm) 0 Epithelia Area: (cm) 0 Tunnelin Volume: (cm) 0 Undermin on in Area: 100% on in Volume: 100% lization: Medium (34-66%) g: No ing: No Wound Description Full Thickness Without Exposed Support Foul Odor Classification: Structures Slough/Fi Wound Margin: Flat and Intact Exudate None Present Amount: After Cleansing: No brino No Wound Bed Granulation Amount: Large (67-100%) Exposed Structure Granulation Quality: Pink Fascia Exposed: No Necrotic Amount: None Present (0%) Fat Layer (Subcutaneous Tissue) Exposed: Yes Tendon Exposed: No Muscle Exposed: No Joint Exposed: No Bone Exposed: No Electronic Signature(s) Signed: 08/31/2019 5:01:42 PM By: Misty Stanley, Shelene L. ([id number]) Entered By: Rodell Perna on 08/31/2019 15:00:30 Rothlisberger, Zahava L. (  [id number]) -------------------------------------------------------------------------------- Vitals Details Patient Name: Waltner, Shandora L. Date of Service: 08/31/2019 1:45 PM Medical Record Number: [id number] Patient Account Number: [id number] Date of Birth/Sex: 09/09/1970 (49 y.o. F) Treating RN: Rodell Perna Primary Care Jacolyn Joaquin: Antony Haste Other Clinician: Referring Sanoe Hazan: Antony Haste Treating Jakai Risse/Extender: Linwood Dibbles, HOYT Weeks in Treatment: 4 Vital  Signs Time Taken: 14:20 Temperature (F): 98.7 Height (in): 64 Pulse (bpm): 59 Weight (lbs): 270 Respiratory Rate (breaths/min): 16 Body Mass Index (BMI): 46.3 Blood Pressure (mmHg): 119/61 Reference Range: 80 - 120 mg / dl Electronic Signature(s) Signed: 08/31/2019 4:13:51 PM By: Dayton Martes RCP, RRT, CHT Entered By: Weyman Rodney, Lucio Edward on 08/31/2019 14:28:38

## 2019-08-31 NOTE — Progress Notes (Addendum)
CORINTHIAN, KEMLER ([id number]) Visit Report for 08/31/2019 Chief Complaint Document Details Patient Name: Adrienne [name], [name] Date of Service: 08/31/2019 1:45 PM Medical Record Number: [id number] Patient Account Number: [id number] Date of Birth/Sex: Mar 27, 1971 (49 y.o. F) Treating RN: Rodell Perna Primary Care Provider: Antony Haste Other Clinician: Referring Provider: Antony Haste Treating Provider/Extender: Linwood Dibbles, Walther Sanagustin Weeks in Treatment: 4 Information Obtained from: Patient Chief Complaint Left gluteal ulcer secondary to pilonidal cyst removal Electronic Signature(s) Signed: 08/31/2019 2:53:22 PM By: Lenda Kelp PA-C Entered By: Lenda Kelp on 08/31/2019 14:53:21 Rampersaud, Lakecia L. ([id number]) -------------------------------------------------------------------------------- HPI Details Patient Name: Gingrich, Lyle L. Date of Service: 08/31/2019 1:45 PM Medical Record Number: [id number] Patient Account Number: [id number] Date of Birth/Sex: 10/11/1970 (49 y.o. F) Treating RN: Rodell Perna Primary Care Provider: Antony Haste Other Clinician: Referring Provider: Antony Haste Treating Provider/Extender: Linwood Dibbles, Lylia Karn Weeks in Treatment: 4 History of Present Illness HPI Description: 08/03/2019 on evaluation today patient actually presents for evaluation concerning a left gluteal pilonidal cyst which occurred roughly 3 weeks ago. [facility] they did remove the cyst based on what the patient tells me today. She also showed me the pictures. With that being said she tells me that since that time this has been showing some signs of improvement although unfortunately she still having a lot of discomfort at times it is just a very awkward spot. She does have a history of heart disease although she seems to be doing well in that regard at this point. There is no signs of active infection at this time. She tells me that they did  attempt to suture this closed once this was removed although based on what I am seeing it looks like that basically she just had to heal by second intent allowing granulation/scar tissue to fill in at this point. Again it looks like there was probably quite a bit of a cavity that was exposed and removed as a result of the procedure and again that somewhat difficult sometimes to suture back closed. 08/10/2019 on evaluation today patient actually appears to be doing better with regard to her wound. This is measuring smaller and overall is doing quite well. She is pleased with the progress is being made at this time. No fevers, chills, nausea, vomiting, or diarrhea. 08/31/2019 on evaluation today patient appears to be completely healed in regard to the wound on the left gluteal region today. She has been doing excellent with the dressing changes and has not noted any drainage over the past several days. Fortunately there is no pain either which is also excellent news. Electronic Signature(s) Signed: 09/01/2019 9:13:36 AM By: Lenda Kelp PA-C Entered By: Lenda Kelp on 09/01/2019 09:13:35 Zeitler, Macarthur Critchley ([id number]) -------------------------------------------------------------------------------- Physical Exam Details Patient Name: Muriel, Cay[place] L. Date of Service: 08/31/2019 1:45 PM Medical Record Number: [id number] Patient Account Number: [id number] Date of Birth/Sex: 1970-08-25 (49 y.o. F) Treating RN: Rodell Perna Primary Care Provider: Antony Haste Other Clinician: Referring Provider: Antony Haste Treating Provider/Extender: STONE III, Lyne Khurana Weeks in Treatment: 4 Constitutional Well-nourished and well-hydrated in no acute distress. Respiratory normal breathing without difficulty. Psychiatric this patient is able to make decisions and demonstrates good insight into disease process. Alert and Oriented x 3. pleasant and cooperative. Notes Patient's wound bed currently showed  signs of good epithelization at this point. There does not appear to be any open area there is a little bit of a skin tag with undermining although  this is actually healed under the base I explained to her previously that this was something that would likely happen. Over time it may fill in but it also may continue to just be a dimple type area. Either way it appears to be completely healed today. Electronic Signature(s) Signed: 09/01/2019 9:14:07 AM By: Lenda Kelp PA-C Entered By: Lenda Kelp on 09/01/2019 09:14:07 Cutright, Macarthur Critchley ([id number]) -------------------------------------------------------------------------------- Physician Orders Details Patient Name: Cazier, Nalaysia L. Date of Service: 08/31/2019 1:45 PM Medical Record Number: [id number] Patient Account Number: [id number] Date of Birth/Sex: 21-Jan-1971 (49 y.o. F) Treating RN: Rodell Perna Primary Care Provider: Antony Haste Other Clinician: Referring Provider: Antony Haste Treating Provider/Extender: Linwood Dibbles, Adriane Gabbert Weeks in Treatment: 4 Verbal / Phone Orders: No Diagnosis Coding ICD-10 Coding Code Description L05.01 Pilonidal cyst with abscess L98.412 Non-pressure chronic ulcer of buttock with fat layer exposed I25.10 Atherosclerotic heart disease of native coronary artery without angina pectoris Discharge From [facility] o Discharge from [facility] - Treatment complete Electronic Signature(s) Signed: 08/31/2019 5:01:42 PM By: Rodell Perna Signed: 09/01/2019 9:46:35 AM By: Lenda Kelp PA-C Entered By: Rodell Perna on 08/31/2019 15:00:49 Kraker, Ria L. ([id number]) -------------------------------------------------------------------------------- Problem List Details Patient Name: Verstraete, Neeti L. Date of Service: 08/31/2019 1:45 PM Medical Record Number: [id number] Patient Account Number: [id number] Date of Birth/Sex: 24-Nov-1970 (49 y.o. F) Treating RN: Rodell Perna Primary Care Provider:  Antony Haste Other Clinician: Referring Provider: Antony Haste Treating Provider/Extender: Linwood Dibbles, Vishwa Dais Weeks in Treatment: 4 Active Problems ICD-10 Evaluated Encounter Code Description Active Date Today Diagnosis L05.01 Pilonidal cyst with abscess 08/03/2019 No Yes L98.412 Non-pressure chronic ulcer of buttock with fat layer exposed 08/03/2019 No Yes I25.10 Atherosclerotic heart disease of native coronary artery 08/03/2019 No Yes without angina pectoris Inactive Problems Resolved Problems Electronic Signature(s) Signed: 08/31/2019 2:53:05 PM By: Lenda Kelp PA-C Entered By: Lenda Kelp on 08/31/2019 14:53:04 Gosse, Jeaninne L. ([id number]) -------------------------------------------------------------------------------- Progress Note Details Patient Name: Nevins, Chaya L. Date of Service: 08/31/2019 1:45 PM Medical Record Number: [id number] Patient Account Number: [id number] Date of Birth/Sex: 06-28-1971 (49 y.o. F) Treating RN: Rodell Perna Primary Care Provider: Antony Haste Other Clinician: Referring Provider: Antony Haste Treating Provider/Extender: Linwood Dibbles, Yesennia Hirota Weeks in Treatment: 4 Subjective Chief Complaint Information obtained from Patient Left gluteal ulcer secondary to pilonidal cyst removal History of Present Illness (HPI) 08/03/2019 on evaluation today patient actually presents for evaluation concerning a left gluteal pilonidal cyst which occurred roughly 3 weeks ago. [facility] they did remove the cyst based on what the patient tells me today. She also showed me the pictures. With that being said she tells me that since that time this has been showing some signs of improvement although unfortunately she still having a lot of discomfort at times it is just a very awkward spot. She does have a history of heart disease although she seems to be doing well in that regard at this point. There is no signs of  active infection at this time. She tells me that they did attempt to suture this closed once this was removed although based on what I am seeing it looks like that basically she just had to heal by second intent allowing granulation/scar tissue to fill in at this point. Again it looks like there was probably quite a bit of a cavity that was exposed and removed as a result of the procedure and again that somewhat difficult sometimes to suture  back closed. 08/10/2019 on evaluation today patient actually appears to be doing better with regard to her wound. This is measuring smaller and overall is doing quite well. She is pleased with the progress is being made at this time. No fevers, chills, nausea, vomiting, or diarrhea. 08/31/2019 on evaluation today patient appears to be completely healed in regard to the wound on the left gluteal region today. She has been doing excellent with the dressing changes and has not noted any drainage over the past several days. Fortunately there is no pain either which is also excellent news. Objective Constitutional Well-nourished and well-hydrated in no acute distress. Vitals Time Taken: 2:20 PM, Height: 64 in, Weight: 270 lbs, BMI: 46.3, Temperature: 98.7 F, Pulse: 59 bpm, Respiratory Rate: 16 breaths/min, Blood Pressure: 119/61 mmHg. Respiratory normal breathing without difficulty. Psychiatric this patient is able to make decisions and demonstrates good insight into disease process. Alert and Oriented x 3. pleasant and cooperative. Razavi, [place] ([id number]) General Notes: Patient's wound bed currently showed signs of good epithelization at this point. There does not appear to be any open area there is a little bit of a skin tag with undermining although this is actually healed under the base I explained to her previously that this was something that would likely happen. Over time it may fill in but it also may continue to just be a dimple type area.  Either way it appears to be completely healed today. Integumentary (Hair, Skin) Wound #1 status is Healed - Epithelialized. Original cause of wound was Bump. The wound is located on the Left Gluteus. The wound measures 0cm length x 0cm width x 0cm depth; 0cm^2 area and 0cm^3 volume. There is Fat Layer (Subcutaneous Tissue) Exposed exposed. There is no tunneling or undermining noted. There is a none present amount of drainage noted. The wound margin is flat and intact. There is large (67-100%) pink granulation within the wound bed. There is no necrotic tissue within the wound bed. 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 My suggestion at this time is good to be that we go ahead and continue to monitor for anything worsening at this point although I do not think she is good to have any issues that only if she has to have any specific dressings on the area at this time. 2. I would recommend that we discontinue wound care services since the patient does appear to be healed. Patient will follow-up as needed Electronic Signature(s) Signed: 09/01/2019 9:17:32 AM By: Worthy Keeler PA-C Entered By: Worthy Keeler on 09/01/2019 09:17:32 Konen, Margee L. ([id number]) -------------------------------------------------------------------------------- SuperBill Details Patient Name: Faircloth, Sanai L. Date of Service: 08/31/2019 Medical Record Number: [id number] Patient Account Number: [id number] Date of Birth/Sex: 1970-09-17 (49 y.o. F) Treating RN: Army Melia Primary Care Provider: Anastasia Pall Other Clinician: Referring Provider: Anastasia Pall Treating Provider/Extender: Melburn Hake, Judah Carchi Weeks in Treatment: 4 Diagnosis Coding ICD-10  Codes Code Description L05.01 Pilonidal cyst with abscess L98.412 Non-pressure chronic ulcer of buttock with fat layer exposed I25.10 Atherosclerotic heart disease of native coronary artery without angina pectoris Facility Procedures CPT4 Code: [id number] Description: 99213 - WOUND CARE VISIT-LEV 3 EST PT Modifier: Quantity: 1 Physician Procedures CPT4 Code Description: [id number] - WC PHYS LEVEL 2 - EST PT ICD-10 Diagnosis Description L05.01 Pilonidal cyst  with abscess L98.412 Non-pressure chronic ulcer of buttock with fat layer exposed I25.10 Atherosclerotic heart disease of native coronary  artery witho Modifier: ut angina pectori Quantity: 1 s Electronic Signature(s) Signed: 09/01/2019 9:17:46 AM By: Lenda Kelp PA-C Entered By: Lenda Kelp on 09/01/2019 09:17:46

## 2019-09-06 ENCOUNTER — Other Ambulatory Visit: Payer: Self-pay

## 2019-09-06 ENCOUNTER — Other Ambulatory Visit: Payer: BC Managed Care – PPO | Admitting: *Deleted

## 2019-09-06 DIAGNOSIS — E782 Mixed hyperlipidemia: Secondary | ICD-10-CM

## 2019-09-07 ENCOUNTER — Encounter: Payer: Self-pay | Admitting: Pharmacist

## 2019-09-07 ENCOUNTER — Telehealth: Payer: Self-pay | Admitting: Pharmacist

## 2019-09-07 LAB — LIPID PANEL
Chol/HDL Ratio: 1.5 ratio (ref 0.0–4.4)
Cholesterol, Total: 123 mg/dL (ref 100–199)
HDL: 80 mg/dL (ref >39)
LDL Chol Calc (NIH): 12 mg/dL (ref 0–99)
Triglycerides: 205 mg/dL — ABNORMAL HIGH (ref 0–149)
VLDL Cholesterol Cal: 31 mg/dL (ref 5–40)

## 2019-09-07 LAB — HEPATIC FUNCTION PANEL
ALT: 17 IU/L (ref 0–32)
AST: 23 IU/L (ref 0–40)
Albumin: 4 g/dL (ref 3.8–4.8)
Alkaline Phosphatase: 122 IU/L — ABNORMAL HIGH (ref 39–117)
Bilirubin Total: 0.3 mg/dL (ref 0.0–1.2)
Bilirubin, Direct: 0.11 mg/dL (ref 0.00–0.40)
Total Protein: 6.6 g/dL (ref 6.0–8.5)

## 2019-09-07 MED ORDER — ICOSAPENT ETHYL 1 G PO CAPS: 2.0000 g | ORAL_CAPSULE | Freq: Two times a day (BID) | ORAL | 3 refills | Status: DC

## 2019-09-07 NOTE — Telephone Encounter (Signed)
Patient made aware of message below. Will pick up today. Patient will call if there is any issue with cost. Advised that if she experiences fishy burp she can put in refrigerator to help.

## 2019-09-07 NOTE — Telephone Encounter (Signed)
TG > 135 on most recent lipid panel, can now reapply for Vascepa coverage.  Prior auth submitted for Vascepa and approved. Will start Vascepa 2g twice daily. Pt will continue Repatha injections and rosuvastatin 40mg  daily.  Left message for pt to advise her of lab results and to add Vascepa (we had previously tried to add this after her last lipid visit but TG were too low so insurance would not cover at the time). Rx and copay card have already been sent to her pharmacy.

## 2019-09-19 ENCOUNTER — Telehealth: Payer: Self-pay | Admitting: Interventional Cardiology

## 2019-09-19 NOTE — Telephone Encounter (Signed)
Spoke with pt and made her aware that it is ok to get the MRI.  Pt appreciative for call.

## 2019-09-19 NOTE — Telephone Encounter (Signed)
Pt's primary card is Dr. Katrinka Blazing. Pt's see's Dr. Mayford Knife for sleep apnea.

## 2019-09-19 NOTE — Telephone Encounter (Signed)
Patient is having an MRI done on her back on Friday 09/22/19. She had a stent put in July 2019. Patient wants to know if the stent will be affected by the MRI.

## 2019-10-02 DIAGNOSIS — M5416 Radiculopathy, lumbar region: Secondary | ICD-10-CM | POA: Diagnosis not present

## 2019-10-10 DIAGNOSIS — M5136 Other intervertebral disc degeneration, lumbar region: Secondary | ICD-10-CM | POA: Diagnosis not present

## 2019-10-27 DIAGNOSIS — M5137 Other intervertebral disc degeneration, lumbosacral region: Secondary | ICD-10-CM | POA: Diagnosis not present

## 2019-11-13 DIAGNOSIS — M5136 Other intervertebral disc degeneration, lumbar region: Secondary | ICD-10-CM | POA: Diagnosis not present

## 2019-11-13 DIAGNOSIS — M47816 Spondylosis without myelopathy or radiculopathy, lumbar region: Secondary | ICD-10-CM | POA: Diagnosis not present

## 2019-11-21 ENCOUNTER — Telehealth: Payer: Self-pay | Admitting: Interventional Cardiology

## 2019-11-21 DIAGNOSIS — E782 Mixed hyperlipidemia: Secondary | ICD-10-CM

## 2019-11-21 MED ORDER — SIMVASTATIN 40 MG PO TABS: 40.0000 mg | ORAL_TABLET | Freq: Every day | ORAL | 11 refills | Status: DC

## 2019-11-21 NOTE — Telephone Encounter (Signed)
Reached out to Ms. Kincaid over the phone regarding her worsening back pain. It is unlikely that patient's worsened back pain is due to Repatha based on patient's chronic back pain issues and the incidence of myalgia in patients using Repatha (4%). Patient's LDL goal is <55 due to extensive CAD at a young age. Patient was started on Repatha after not achieving this LDL goal on rosuvastatin 40mg  daily (LDL 79). Patient would greatly benefit from staying on Repatha and addressing other causes of back pain.   Patient was switched from simvastatin to rosuvastatin in July and was unsure if she felt any of these muscle pains following that switch and prior to starting Repatha in November. Because of patient's prior tolerance of simvastatin 40mg  daily and the LDL lowering response from Repatha, will continue Repatha and switch back to simvastatin 40mg  daily per patient's preference. Patient was agreeable to trying this and rechecking a lipid panel in 8 weeks.

## 2019-11-21 NOTE — Telephone Encounter (Signed)
Transferred call to Jennifer.

## 2019-11-21 NOTE — Telephone Encounter (Signed)
Pt has chronic issues with back pain.  States this has worsened since Oct or Nov, after starting Repatha.  States she can only walk 2-3 mins before she develops an achy feeling in her "butt and upper leg muscles".  States she had no issues with Simvastatin and was wondering about going back to that.  Also states she has had a lot of weight gain but is not as mobile as she use to be due to back pain. Takes Ibuprofen to help relieve pain.  Advised I will send message to the pharmacy to review cholesterol labs and meds and see if possible changes can be made.

## 2019-11-21 NOTE — Telephone Encounter (Signed)
Will send call to primary card nurse as which medication is not specified.

## 2019-11-21 NOTE — Telephone Encounter (Signed)
New Message:     Please call, question about her medicine, she have some concerns.

## 2019-11-21 NOTE — Telephone Encounter (Signed)
Left message to call back  

## 2019-12-02 DIAGNOSIS — M47816 Spondylosis without myelopathy or radiculopathy, lumbar region: Secondary | ICD-10-CM | POA: Diagnosis not present

## 2019-12-04 DIAGNOSIS — N951 Menopausal and female climacteric states: Secondary | ICD-10-CM | POA: Diagnosis not present

## 2019-12-04 DIAGNOSIS — E785 Hyperlipidemia, unspecified: Secondary | ICD-10-CM | POA: Diagnosis not present

## 2019-12-04 DIAGNOSIS — M4306 Spondylolysis, lumbar region: Secondary | ICD-10-CM | POA: Diagnosis not present

## 2019-12-04 DIAGNOSIS — Z6841 Body Mass Index (BMI) 40.0 and over, adult: Secondary | ICD-10-CM | POA: Diagnosis not present

## 2019-12-04 DIAGNOSIS — M5136 Other intervertebral disc degeneration, lumbar region: Secondary | ICD-10-CM | POA: Diagnosis not present

## 2019-12-04 DIAGNOSIS — G8929 Other chronic pain: Secondary | ICD-10-CM | POA: Diagnosis not present

## 2019-12-04 DIAGNOSIS — R5383 Other fatigue: Secondary | ICD-10-CM | POA: Diagnosis not present

## 2019-12-04 DIAGNOSIS — M545 Low back pain: Secondary | ICD-10-CM | POA: Diagnosis not present

## 2019-12-04 DIAGNOSIS — N926 Irregular menstruation, unspecified: Secondary | ICD-10-CM | POA: Diagnosis not present

## 2019-12-04 DIAGNOSIS — I214 Non-ST elevation (NSTEMI) myocardial infarction: Secondary | ICD-10-CM | POA: Diagnosis not present

## 2019-12-04 DIAGNOSIS — E559 Vitamin D deficiency, unspecified: Secondary | ICD-10-CM | POA: Diagnosis not present

## 2019-12-13 ENCOUNTER — Other Ambulatory Visit: Payer: Self-pay | Admitting: Interventional Cardiology

## 2019-12-20 ENCOUNTER — Telehealth: Payer: Self-pay | Admitting: Interventional Cardiology

## 2019-12-20 NOTE — Telephone Encounter (Signed)
Spoke with pt and made her aware to contact the pharmacy to make sure they gave her the correct medication.  Advised sometimes pharmacies change manufacturers which can cause the pill to look different.  Advised we would not know that information so it was best to contact the pharmacy to verify.

## 2019-12-20 NOTE — Telephone Encounter (Signed)
   Pt c/o medication issue:  1. Name of Medication: metoprolol tartrate (LOPRESSOR) 25 MG tablet  2. How are you currently taking this medication (dosage and times per day)? Take 1 tablet by mouth twice daily  3. Are you having a reaction (difficulty breathing--STAT)?   4. What is your medication issue?  Pt is calling, she wanted to make sure she have the correct pill, she recently refill her metoprolol and noticed the pill is in different color and shape.  Please call

## 2019-12-21 DIAGNOSIS — M5136 Other intervertebral disc degeneration, lumbar region: Secondary | ICD-10-CM | POA: Diagnosis not present

## 2020-01-09 DIAGNOSIS — M545 Low back pain: Secondary | ICD-10-CM | POA: Diagnosis not present

## 2020-01-10 ENCOUNTER — Other Ambulatory Visit: Payer: Self-pay | Admitting: Interventional Cardiology

## 2020-01-10 DIAGNOSIS — M545 Low back pain: Secondary | ICD-10-CM | POA: Diagnosis not present

## 2020-01-17 ENCOUNTER — Other Ambulatory Visit: Payer: BC Managed Care – PPO

## 2020-01-25 DIAGNOSIS — M5116 Intervertebral disc disorders with radiculopathy, lumbar region: Secondary | ICD-10-CM | POA: Diagnosis not present

## 2020-01-25 DIAGNOSIS — M9905 Segmental and somatic dysfunction of pelvic region: Secondary | ICD-10-CM | POA: Diagnosis not present

## 2020-01-25 DIAGNOSIS — M25551 Pain in right hip: Secondary | ICD-10-CM | POA: Diagnosis not present

## 2020-01-25 DIAGNOSIS — M9903 Segmental and somatic dysfunction of lumbar region: Secondary | ICD-10-CM | POA: Diagnosis not present

## 2020-01-30 DIAGNOSIS — M47816 Spondylosis without myelopathy or radiculopathy, lumbar region: Secondary | ICD-10-CM | POA: Diagnosis not present

## 2020-01-30 DIAGNOSIS — M5116 Intervertebral disc disorders with radiculopathy, lumbar region: Secondary | ICD-10-CM | POA: Diagnosis not present

## 2020-01-30 DIAGNOSIS — M9905 Segmental and somatic dysfunction of pelvic region: Secondary | ICD-10-CM | POA: Diagnosis not present

## 2020-01-30 DIAGNOSIS — M9903 Segmental and somatic dysfunction of lumbar region: Secondary | ICD-10-CM | POA: Diagnosis not present

## 2020-01-30 DIAGNOSIS — M25551 Pain in right hip: Secondary | ICD-10-CM | POA: Diagnosis not present

## 2020-02-05 DIAGNOSIS — M9905 Segmental and somatic dysfunction of pelvic region: Secondary | ICD-10-CM | POA: Diagnosis not present

## 2020-02-05 DIAGNOSIS — M25551 Pain in right hip: Secondary | ICD-10-CM | POA: Diagnosis not present

## 2020-02-05 DIAGNOSIS — M9903 Segmental and somatic dysfunction of lumbar region: Secondary | ICD-10-CM | POA: Diagnosis not present

## 2020-02-05 DIAGNOSIS — M5116 Intervertebral disc disorders with radiculopathy, lumbar region: Secondary | ICD-10-CM | POA: Diagnosis not present

## 2020-02-06 DIAGNOSIS — M9905 Segmental and somatic dysfunction of pelvic region: Secondary | ICD-10-CM | POA: Diagnosis not present

## 2020-02-06 DIAGNOSIS — M5116 Intervertebral disc disorders with radiculopathy, lumbar region: Secondary | ICD-10-CM | POA: Diagnosis not present

## 2020-02-06 DIAGNOSIS — M25551 Pain in right hip: Secondary | ICD-10-CM | POA: Diagnosis not present

## 2020-02-06 DIAGNOSIS — M9903 Segmental and somatic dysfunction of lumbar region: Secondary | ICD-10-CM | POA: Diagnosis not present

## 2020-02-10 DIAGNOSIS — M545 Low back pain: Secondary | ICD-10-CM | POA: Diagnosis not present

## 2020-02-12 DIAGNOSIS — M25551 Pain in right hip: Secondary | ICD-10-CM | POA: Diagnosis not present

## 2020-02-12 DIAGNOSIS — M9903 Segmental and somatic dysfunction of lumbar region: Secondary | ICD-10-CM | POA: Diagnosis not present

## 2020-02-12 DIAGNOSIS — M5116 Intervertebral disc disorders with radiculopathy, lumbar region: Secondary | ICD-10-CM | POA: Diagnosis not present

## 2020-02-12 DIAGNOSIS — M9905 Segmental and somatic dysfunction of pelvic region: Secondary | ICD-10-CM | POA: Diagnosis not present

## 2020-02-13 DIAGNOSIS — M25551 Pain in right hip: Secondary | ICD-10-CM | POA: Diagnosis not present

## 2020-02-13 DIAGNOSIS — M5116 Intervertebral disc disorders with radiculopathy, lumbar region: Secondary | ICD-10-CM | POA: Diagnosis not present

## 2020-02-13 DIAGNOSIS — M9905 Segmental and somatic dysfunction of pelvic region: Secondary | ICD-10-CM | POA: Diagnosis not present

## 2020-02-13 DIAGNOSIS — M9903 Segmental and somatic dysfunction of lumbar region: Secondary | ICD-10-CM | POA: Diagnosis not present

## 2020-02-15 DIAGNOSIS — M9905 Segmental and somatic dysfunction of pelvic region: Secondary | ICD-10-CM | POA: Diagnosis not present

## 2020-02-15 DIAGNOSIS — M9903 Segmental and somatic dysfunction of lumbar region: Secondary | ICD-10-CM | POA: Diagnosis not present

## 2020-02-15 DIAGNOSIS — M5116 Intervertebral disc disorders with radiculopathy, lumbar region: Secondary | ICD-10-CM | POA: Diagnosis not present

## 2020-02-15 DIAGNOSIS — M25551 Pain in right hip: Secondary | ICD-10-CM | POA: Diagnosis not present

## 2020-02-19 DIAGNOSIS — M9903 Segmental and somatic dysfunction of lumbar region: Secondary | ICD-10-CM | POA: Diagnosis not present

## 2020-02-19 DIAGNOSIS — M25551 Pain in right hip: Secondary | ICD-10-CM | POA: Diagnosis not present

## 2020-02-19 DIAGNOSIS — M5116 Intervertebral disc disorders with radiculopathy, lumbar region: Secondary | ICD-10-CM | POA: Diagnosis not present

## 2020-02-19 DIAGNOSIS — M9905 Segmental and somatic dysfunction of pelvic region: Secondary | ICD-10-CM | POA: Diagnosis not present

## 2020-02-20 DIAGNOSIS — M25551 Pain in right hip: Secondary | ICD-10-CM | POA: Diagnosis not present

## 2020-02-20 DIAGNOSIS — M9903 Segmental and somatic dysfunction of lumbar region: Secondary | ICD-10-CM | POA: Diagnosis not present

## 2020-02-20 DIAGNOSIS — M5116 Intervertebral disc disorders with radiculopathy, lumbar region: Secondary | ICD-10-CM | POA: Diagnosis not present

## 2020-02-20 DIAGNOSIS — M9905 Segmental and somatic dysfunction of pelvic region: Secondary | ICD-10-CM | POA: Diagnosis not present

## 2020-02-29 DIAGNOSIS — M9905 Segmental and somatic dysfunction of pelvic region: Secondary | ICD-10-CM | POA: Diagnosis not present

## 2020-02-29 DIAGNOSIS — M5116 Intervertebral disc disorders with radiculopathy, lumbar region: Secondary | ICD-10-CM | POA: Diagnosis not present

## 2020-02-29 DIAGNOSIS — M9903 Segmental and somatic dysfunction of lumbar region: Secondary | ICD-10-CM | POA: Diagnosis not present

## 2020-02-29 DIAGNOSIS — M25551 Pain in right hip: Secondary | ICD-10-CM | POA: Diagnosis not present

## 2020-03-05 DIAGNOSIS — M25551 Pain in right hip: Secondary | ICD-10-CM | POA: Diagnosis not present

## 2020-03-05 DIAGNOSIS — M9905 Segmental and somatic dysfunction of pelvic region: Secondary | ICD-10-CM | POA: Diagnosis not present

## 2020-03-05 DIAGNOSIS — M9903 Segmental and somatic dysfunction of lumbar region: Secondary | ICD-10-CM | POA: Diagnosis not present

## 2020-03-05 DIAGNOSIS — M5116 Intervertebral disc disorders with radiculopathy, lumbar region: Secondary | ICD-10-CM | POA: Diagnosis not present

## 2020-03-07 DIAGNOSIS — M25551 Pain in right hip: Secondary | ICD-10-CM | POA: Diagnosis not present

## 2020-03-07 DIAGNOSIS — M9903 Segmental and somatic dysfunction of lumbar region: Secondary | ICD-10-CM | POA: Diagnosis not present

## 2020-03-07 DIAGNOSIS — M9905 Segmental and somatic dysfunction of pelvic region: Secondary | ICD-10-CM | POA: Diagnosis not present

## 2020-03-07 DIAGNOSIS — M5116 Intervertebral disc disorders with radiculopathy, lumbar region: Secondary | ICD-10-CM | POA: Diagnosis not present

## 2020-03-11 DIAGNOSIS — M545 Low back pain: Secondary | ICD-10-CM | POA: Diagnosis not present

## 2020-03-21 NOTE — Progress Notes (Signed)
Cardiology Office Note:    Date:  03/22/2020   ID:  Adrienne [name], DOB June 23, 1971, MRN [id number]  PCP:  Eartha Inch, MD  Cardiologist:  Lesleigh Noe, MD   Referring MD: Eartha Inch, MD   No chief complaint on file.   History of Present Illness:    Adrienne [name] is a 49 y.o. female with a hx of OSA, non-STEMI, CAD with ostial RCA stent 02/2018, and hyperlipidemia.  She denies chest pain. She vapes. She denies orthopnea, PND, palpitations, and syncope. No lower extremity swelling.  Past Medical History:  Diagnosis Date  . CAD (coronary artery disease) 03/18/2018   S/p NSTEMI 7/19: LHC - oLAD 50, mLCx 50, oRCA 90 >> PCI:  DES to [facility] // Echo 7/19:  Mild concentric LVH, EF 65-70, normal wall motion, normal diastolic function, trivial TR  . Coronary artery disease   . GERD (gastroesophageal reflux disease)   . Hyperlipidemia   . Morbid obesity (HCC) 07/29/2018  . OSA (obstructive sleep apnea) 07/29/2018   Severe OSA with an AHI of 48/hr and no central sleep apnea.  Her O2 sats dropped to 77% with respiratory events. She is now on auto CPAP    Past Surgical History:  Procedure Laterality Date  . CARDIAC CATHETERIZATION  02/25/2018  . CESAREAN SECTION    . CORONARY STENT INTERVENTION N/A 02/25/2018   Procedure: CORONARY STENT INTERVENTION;  Surgeon: Tonny Bollman, MD;  Location: [facility] INVASIVE CV LAB;  Service: Cardiovascular;  Laterality: N/A;  . finger surgery    . LEFT HEART CATH AND CORONARY ANGIOGRAPHY N/A 02/23/2018   Procedure: LEFT HEART CATH AND CORONARY ANGIOGRAPHY;  Surgeon: Kathleene Hazel, MD;  Location: [facility] INVASIVE CV LAB;  Service: Cardiovascular;  Laterality: N/A;    Current Medications: Current Meds  Medication Sig  . albuterol (PROAIR HFA) 108 (90 Base) MCG/ACT inhaler Inhale 1-2 puffs into the lungs every 6 (six) hours as needed for wheezing or shortness of breath.  . ALPRAZolam (XANAX) 1 MG tablet Take 0.5-1 mg by mouth 3 (three)  times daily as needed for anxiety.   Marland Kitchen aspirin 81 MG tablet Take 81 mg by mouth daily.  . baclofen (LIORESAL) 10 MG tablet Take 10 mg by mouth as needed for muscle spasms.  Marland Kitchen escitalopram (LEXAPRO) 20 MG tablet Take 10 mg by mouth daily.   . Evolocumab (REPATHA SURECLICK) 140 MG/ML SOAJ Inject 1 pen into the skin every 14 (fourteen) days.  . fluticasone (FLONASE) 50 MCG/ACT nasal spray Place 1 spray into both nostrils as needed for allergies.   Marland Kitchen HYDROcodone-acetaminophen (NORCO) 10-325 MG tablet Take 1 tablet by mouth 3 (three) times daily as needed.  Marland Kitchen ibuprofen (ADVIL,MOTRIN) 200 MG tablet Take 600-800 mg by mouth every 6 (six) hours as needed (for pain, headaches, or cramps).  Marland Kitchen icosapent Ethyl (VASCEPA) 1 g capsule Take 2 capsules (2 g total) by mouth 2 (two) times daily.  . metoprolol tartrate (LOPRESSOR) 25 MG tablet Take 1 tablet by mouth twice daily  . Multiple Vitamin (MULTIVITAMIN) tablet Take 1 tablet by mouth daily.  . nitroGLYCERIN (NITROSTAT) 0.4 MG SL tablet PLACE 1 TABLET UNDER THE TONGUE EVERY 5 MINUTES AS NEEDED FOR CHEST PAIN  . omeprazole (PRILOSEC) 40 MG capsule Take 40 mg by mouth daily.  . ranitidine (ZANTAC) 150 MG capsule Take 150 mg by mouth as needed for heartburn.  . simvastatin (ZOCOR) 40 MG tablet Take 1 tablet (40 mg total) by mouth at bedtime.  Allergies:   Latex, Ciprofloxacin, and Amoxicillin   Social History   Socioeconomic History  . Marital status: Married    Spouse name: Not on file  . Number of children: Not on file  . Years of education: Not on file  . Highest education level: Not on file  Occupational History  . Not on file  Tobacco Use  . Smoking status: Former Smoker    Types: Cigarettes    Quit date: 02/22/2018    Years since quitting: 2.0  . Smokeless tobacco: Never Used  Vaping Use  . Vaping Use: Never used  Substance and Sexual Activity  . Alcohol use: No  . Drug use: No  . Sexual activity: Not on file  Other Topics Concern  .  Not on file  Social History Narrative  . Not on file   Social Determinants of Health   Financial Resource Strain:   . Difficulty of Paying Living Expenses:   Food Insecurity:   . Worried About Programme researcher, broadcasting/film/video in the Last Year:   . Barista in the Last Year:   Transportation Needs:   . Freight forwarder (Medical):   Marland Kitchen Lack of Transportation (Non-Medical):   Physical Activity:   . Days of Exercise per Week:   . Minutes of Exercise per Session:   Stress:   . Feeling of Stress :   Social Connections:   . Frequency of Communication with Friends and Family:   . Frequency of Social Gatherings with Friends and Family:   . Attends Religious Services:   . Active Member of Clubs or Organizations:   . Attends Banker Meetings:   Marland Kitchen Marital Status:      Family History: The patient's family history includes CAD in her mother.  ROS:   Please see the history of present illness.    Debilitated by low back discomfort. She and her mother are staunch supporters of antivaccine for [employer]. All other systems reviewed and are negative.  EKGs/Labs/Other Studies Reviewed:    The following studies were reviewed today: No new data  EKG:  EKG normal sinus rhythm, diffuse nonspecific T wave abnormality.  Low voltage.  When compared to prior tracing performed on 03/21/2019  Recent Labs: 05/04/2019: BUN 10; Creatinine, Ser 0.69; Potassium 4.2; Sodium 139 09/06/2019: ALT 17  Recent Lipid Panel    Component Value Date/Time   CHOL 123 09/06/2019 1635   TRIG 205 (H) 09/06/2019 1635   HDL 80 09/06/2019 1635   CHOLHDL 1.5 09/06/2019 1635   CHOLHDL 3.4 02/26/2018 0417   VLDL 28 02/26/2018 0417   LDLCALC 12 09/06/2019 1635    Physical Exam:    VS:  BP (!) 132/76   Pulse 60   Ht 5\' 4"  (1.626 m)   Wt (!) 285 lb (129.3 kg)   SpO2 96%   BMI 48.92 kg/m     Wt Readings from Last 3 Encounters:  03/22/20 (!) 285 lb (129.3 kg)  05/24/19 270 lb 8 oz (122.7 kg)  03/20/19  264 lb 8 oz (120 kg)     GEN: Morbid obesity. No acute distress HEENT: Normal NECK: No JVD. LYMPHATICS: No lymphadenopathy CARDIAC:  RRR without murmur, gallop, or edema. VASCULAR:  Normal Pulses. No bruits. RESPIRATORY:  Clear to auscultation without rales, wheezing or rhonchi  ABDOMEN: Soft, non-tender, non-distended, No pulsatile mass, MUSCULOSKELETAL: No deformity  SKIN: Warm and dry NEUROLOGIC:  Alert and oriented x 3 PSYCHIATRIC:  Normal affect   ASSESSMENT:  1. Coronary artery disease involving native coronary artery of native heart without angina pectoris   2. Mixed hyperlipidemia   3. OSA (obstructive sleep apnea)   4. Morbid obesity (HCC)   5. Tobacco abuse   6. Educated about COVID-19 virus infection    PLAN:    In order of problems listed above:  1. Secondary prevention discussed. Unable to exercise because of her back. 2. Continue Repatha, Vascepa, and Zocor. Lipid panel is obtained today. 3. Encouraged CPAP. 4. Ambulation is much as possible with decrease carbohydrate intake. 5. Discouraged vaping and smoking. 6. She is anti-COVID-19 vaccine. She is at high risk for poor outcome if she gets Covid.  Encouraged proactive prevention rather than secondary management of acute illness. This would also include becoming vaccinated.   Medication Adjustments/Labs and Tests Ordered: Current medicines are reviewed at length with the patient today.  Concerns regarding medicines are outlined above.  Orders Placed This Encounter  Procedures  . EKG 12-Lead   No orders of the defined types were placed in this encounter.   Patient Instructions  Medication Instructions:  Your physician recommends that you continue on your current medications as directed. Please refer to the Current Medication list given to you today.  *If you need a refill on your cardiac medications before your next appointment, please call your pharmacy*   Lab Work: None If you have labs (blood  work) drawn today and your tests are completely normal, you will receive your results only by: Marland Kitchen MyChart Message (if you have MyChart) OR . A paper copy in the mail If you have any lab test that is abnormal or we need to change your treatment, we will call you to review the results.   Testing/Procedures: None   Follow-Up: At [facility], you and your health needs are our priority.  As part of our continuing mission to provide you with exceptional heart care, we have created designated Provider Care Teams.  These Care Teams include your primary Cardiologist (physician) and Advanced Practice Providers (APPs -  Physician Assistants and Nurse Practitioners) who all work together to provide you with the care you need, when you need it.  We recommend signing up for the patient portal called "MyChart".  Sign up information is provided on this After Visit Summary.  MyChart is used to connect with patients for Virtual Visits (Telemedicine).  Patients are able to view lab/test results, encounter notes, upcoming appointments, etc.  Non-urgent messages can be sent to your provider as well.   To learn more about what you can do with MyChart, go to [email or web address].    Your next appointment:   9-12 month(s)  The format for your next appointment:   In Person  Provider:   You may see Lesleigh Noe, MD or one of the following Advanced Practice Providers on your designated Care Team:    Norma Fredrickson, NP  Nada Boozer, NP  Georgie Chard, NP    Other Instructions      Signed, Lesleigh Noe, MD  03/22/2020 4:58 PM    [facility]

## 2020-03-22 ENCOUNTER — Encounter: Payer: Self-pay | Admitting: Interventional Cardiology

## 2020-03-22 ENCOUNTER — Ambulatory Visit (INDEPENDENT_AMBULATORY_CARE_PROVIDER_SITE_OTHER): Payer: BC Managed Care – PPO | Admitting: Interventional Cardiology

## 2020-03-22 ENCOUNTER — Other Ambulatory Visit: Payer: BC Managed Care – PPO | Admitting: *Deleted

## 2020-03-22 ENCOUNTER — Other Ambulatory Visit: Payer: Self-pay

## 2020-03-22 VITALS — BP 132/76 | HR 60 | Ht 64.0 in | Wt 285.0 lb

## 2020-03-22 DIAGNOSIS — E782 Mixed hyperlipidemia: Secondary | ICD-10-CM | POA: Diagnosis not present

## 2020-03-22 DIAGNOSIS — Z720 Tobacco use: Secondary | ICD-10-CM

## 2020-03-22 DIAGNOSIS — I2510 Atherosclerotic heart disease of native coronary artery without angina pectoris: Secondary | ICD-10-CM

## 2020-03-22 DIAGNOSIS — G4733 Obstructive sleep apnea (adult) (pediatric): Secondary | ICD-10-CM

## 2020-03-22 DIAGNOSIS — Z7189 Other specified counseling: Secondary | ICD-10-CM

## 2020-03-22 NOTE — Patient Instructions (Signed)
Medication Instructions:  Your physician recommends that you continue on your current medications as directed. Please refer to the Current Medication list given to you today.  *If you need a refill on your cardiac medications before your next appointment, please call your pharmacy*   Lab Work: None If you have labs (blood work) drawn today and your tests are completely normal, you will receive your results only by: . MyChart Message (if you have MyChart) OR . A paper copy in the mail If you have any lab test that is abnormal or we need to change your treatment, we will call you to review the results.   Testing/Procedures: None   Follow-Up: At [facility], you and your health needs are our priority.  As part of our continuing mission to provide you with exceptional heart care, we have created designated Provider Care Teams.  These Care Teams include your primary Cardiologist (physician) and Advanced Practice Providers (APPs -  Physician Assistants and Nurse Practitioners) who all work together to provide you with the care you need, when you need it.  We recommend signing up for the patient portal called "MyChart".  Sign up information is provided on this After Visit Summary.  MyChart is used to connect with patients for Virtual Visits (Telemedicine).  Patients are able to view lab/test results, encounter notes, upcoming appointments, etc.  Non-urgent messages can be sent to your provider as well.   To learn more about what you can do with MyChart, go to [email or web address].    Your next appointment:   9-12 month(s)  The format for your next appointment:   In Person  Provider:   You may see Henry W Smith III, MD or one of the following Advanced Practice Providers on your designated Care Team:    Lori Gerhardt, NP  Laura Ingold, NP  Jill McDaniel, NP    Other Instructions   

## 2020-03-23 LAB — HEPATIC FUNCTION PANEL
ALT: 16 IU/L (ref 0–32)
AST: 19 IU/L (ref 0–40)
Albumin: 4 g/dL (ref 3.8–4.8)
Alkaline Phosphatase: 110 IU/L (ref 48–121)
Bilirubin Total: 0.3 mg/dL (ref 0.0–1.2)
Bilirubin, Direct: 0.11 mg/dL (ref 0.00–0.40)
Total Protein: 6.3 g/dL (ref 6.0–8.5)

## 2020-03-23 LAB — LIPID PANEL
Chol/HDL Ratio: 1.5 ratio (ref 0.0–4.4)
Cholesterol, Total: 116 mg/dL (ref 100–199)
HDL: 75 mg/dL (ref >39)
LDL Chol Calc (NIH): 23 mg/dL (ref 0–99)
Triglycerides: 96 mg/dL (ref 0–149)
VLDL Cholesterol Cal: 18 mg/dL (ref 5–40)

## 2020-04-03 DIAGNOSIS — E559 Vitamin D deficiency, unspecified: Secondary | ICD-10-CM | POA: Diagnosis not present

## 2020-04-03 DIAGNOSIS — M5136 Other intervertebral disc degeneration, lumbar region: Secondary | ICD-10-CM | POA: Diagnosis not present

## 2020-04-03 DIAGNOSIS — E539 Vitamin B deficiency, unspecified: Secondary | ICD-10-CM | POA: Diagnosis not present

## 2020-04-03 DIAGNOSIS — M545 Low back pain: Secondary | ICD-10-CM | POA: Diagnosis not present

## 2020-04-11 ENCOUNTER — Other Ambulatory Visit: Payer: Self-pay | Admitting: Interventional Cardiology

## 2020-04-11 DIAGNOSIS — M545 Low back pain: Secondary | ICD-10-CM | POA: Diagnosis not present

## 2020-04-15 ENCOUNTER — Telehealth: Payer: Self-pay | Admitting: Interventional Cardiology

## 2020-04-15 NOTE — Telephone Encounter (Signed)
    Pt is calling, she is asking if she can get her labs dione at [address]. She said she will fax the order to DR. Katrinka Blazing, she said the reason why she wants to get it done in our office because she feels safer since they don't do covid testing.

## 2020-05-12 DIAGNOSIS — M545 Low back pain: Secondary | ICD-10-CM | POA: Diagnosis not present

## 2020-06-08 ENCOUNTER — Other Ambulatory Visit: Payer: Self-pay | Admitting: Interventional Cardiology

## 2020-06-11 DIAGNOSIS — M545 Low back pain, unspecified: Secondary | ICD-10-CM | POA: Diagnosis not present

## 2020-06-18 DIAGNOSIS — E539 Vitamin B deficiency, unspecified: Secondary | ICD-10-CM | POA: Diagnosis not present

## 2020-06-18 DIAGNOSIS — E559 Vitamin D deficiency, unspecified: Secondary | ICD-10-CM | POA: Diagnosis not present

## 2020-06-18 DIAGNOSIS — R5383 Other fatigue: Secondary | ICD-10-CM | POA: Diagnosis not present

## 2020-06-18 DIAGNOSIS — M2550 Pain in unspecified joint: Secondary | ICD-10-CM | POA: Diagnosis not present

## 2020-07-03 DIAGNOSIS — Z6841 Body Mass Index (BMI) 40.0 and over, adult: Secondary | ICD-10-CM | POA: Diagnosis not present

## 2020-07-03 DIAGNOSIS — I2510 Atherosclerotic heart disease of native coronary artery without angina pectoris: Secondary | ICD-10-CM | POA: Diagnosis not present

## 2020-07-03 DIAGNOSIS — R946 Abnormal results of thyroid function studies: Secondary | ICD-10-CM | POA: Diagnosis not present

## 2020-07-03 DIAGNOSIS — E785 Hyperlipidemia, unspecified: Secondary | ICD-10-CM | POA: Diagnosis not present

## 2020-07-08 ENCOUNTER — Other Ambulatory Visit: Payer: Self-pay | Admitting: Interventional Cardiology

## 2020-07-11 DIAGNOSIS — M545 Low back pain, unspecified: Secondary | ICD-10-CM | POA: Diagnosis not present

## 2020-07-12 DIAGNOSIS — M545 Low back pain, unspecified: Secondary | ICD-10-CM | POA: Diagnosis not present

## 2020-08-11 DIAGNOSIS — M545 Low back pain, unspecified: Secondary | ICD-10-CM | POA: Diagnosis not present

## 2020-09-19 DIAGNOSIS — G629 Polyneuropathy, unspecified: Secondary | ICD-10-CM | POA: Diagnosis not present

## 2020-10-31 ENCOUNTER — Telehealth: Payer: Self-pay | Admitting: Cardiology

## 2020-10-31 NOTE — Telephone Encounter (Signed)
Add on for tomorrow at 10:40am with Dr. Turner.  

## 2020-11-01 ENCOUNTER — Telehealth (INDEPENDENT_AMBULATORY_CARE_PROVIDER_SITE_OTHER): Payer: BC Managed Care – PPO | Admitting: Cardiology

## 2020-11-01 VITALS — Ht 64.0 in

## 2020-11-01 DIAGNOSIS — G4733 Obstructive sleep apnea (adult) (pediatric): Secondary | ICD-10-CM | POA: Diagnosis not present

## 2020-11-01 NOTE — Patient Instructions (Signed)
Medication Instructions:  Your physician recommends that you continue on your current medications as directed. Please refer to the Current Medication list given to you today.  *If you need a refill on your cardiac medications before your next appointment, please call your pharmacy*   Lab Work: none If you have labs (blood work) drawn today and your tests are completely normal, you will receive your results only by: . MyChart Message (if you have MyChart) OR . A paper copy in the mail If you have any lab test that is abnormal or we need to change your treatment, we will call you to review the results.   Testing/Procedures: none   Follow-Up: At [facility], you and your health needs are our priority.  As part of our continuing mission to provide you with exceptional heart care, we have created designated Provider Care Teams.  These Care Teams include your primary Cardiologist (physician) and Advanced Practice Providers (APPs -  Physician Assistants and Nurse Practitioners) who all work together to provide you with the care you need, when you need it.  We recommend signing up for the patient portal called "MyChart".  Sign up information is provided on this After Visit Summary.  MyChart is used to connect with patients for Virtual Visits (Telemedicine).  Patients are able to view lab/test results, encounter notes, upcoming appointments, etc.  Non-urgent messages can be sent to your provider as well.   To learn more about what you can do with MyChart, go to [email or web address].    Your next appointment:   12 month(s)  The format for your next appointment:   Virtual Visit   Provider:   Traci Turner, MD   Other Instructions   

## 2020-11-01 NOTE — Progress Notes (Signed)
Virtual Visit via Video Note   This visit type was conducted due to national recommendations for restrictions regarding the COVID-19 Pandemic (e.g. social distancing) in an effort to limit this patient's exposure and mitigate transmission in our community.  Due to her co-morbid illnesses, this patient is at least at moderate risk for complications without adequate follow up.  This format is felt to be most appropriate for this patient at this time.  The patient did not have access to video technology/had technical difficulties with video requiring transitioning to audio format only (telephone).  All issues noted in this document were discussed and addressed.  No physical exam could be performed with this format.  Please refer to the patient's chart for her  consent to telehealth for [facility].   Evaluation Performed:  Follow-up visit  This visit type was conducted due to national recommendations for restrictions regarding the COVID-19 Pandemic (e.g. social distancing).  This format is felt to be most appropriate for this patient at this time.  All issues noted in this document were discussed and addressed.  No physical exam was performed (except for noted visual exam findings with Video Visits).  Please refer to the patient's chart (MyChart message for video visits and phone note for telephone visits) for the patient's consent to telehealth for [facility].  Date:  11/01/2020   ID:  Adrienne [name], DOB [birth year], MRN [id number]  Patient Location:  Home  Provider location:   [place]  PCP:  Eartha Inch, MD  Cardiologist:  Verdis Prime, MD Sleep Medicine:  Armanda Magic, MD Electrophysiologist:  None   Chief Complaint:  OSA  History of Present Illness:    Adrienne [name] is a 50 y.o. female who presents via audio/video conferencing for a telehealth visit today.    Adrienne [name] is a 50 y.o. female with a hx of ASCAD who was referred for sleep study and was found  to have severe OSA with an AHI of 48/hr and no central sleep apnea.  Her O2 sats dropped to 77% with respiratory events.  She was started on auto CPAP.  Unfortunately she stopped using it because she has a lot of major back problems and has been seeing an orthopedist and Neurosurgeon.  Also her mom has been sick and she was with her and did not use her device.  She still has not gotten used to her device.      The patient does not have symptoms concerning for COVID-19 infection (fever, chills, cough, or new shortness of breath).   Prior CV studies:   The following studies were reviewed today:  PAP compliance download  Past Medical History:  Diagnosis Date  . CAD (coronary artery disease) 03/18/2018   S/p NSTEMI 7/19: LHC - oLAD 50, mLCx 50, oRCA 90 >> PCI:  DES to [facility] // Echo 7/19:  Mild concentric LVH, EF 65-70, normal wall motion, normal diastolic function, trivial TR  . Coronary artery disease   . GERD (gastroesophageal reflux disease)   . Hyperlipidemia   . Morbid obesity (HCC) 07/29/2018  . OSA (obstructive sleep apnea) 07/29/2018   Severe OSA with an AHI of 48/hr and no central sleep apnea.  Her O2 sats dropped to 77% with respiratory events. She is now on auto CPAP   Past Surgical History:  Procedure Laterality Date  . CARDIAC CATHETERIZATION  02/25/2018  . CESAREAN SECTION    . CORONARY STENT INTERVENTION N/A 02/25/2018   Procedure: CORONARY STENT INTERVENTION;  Surgeon: Tonny Bollman, MD;  Location: [facility] INVASIVE CV LAB;  Service: Cardiovascular;  Laterality: N/A;  . finger surgery    . LEFT HEART CATH AND CORONARY ANGIOGRAPHY N/A 02/23/2018   Procedure: LEFT HEART CATH AND CORONARY ANGIOGRAPHY;  Surgeon: Kathleene Hazel, MD;  Location: [facility] INVASIVE CV LAB;  Service: Cardiovascular;  Laterality: N/A;     Current Meds  Medication Sig  . ALPRAZolam (XANAX) 1 MG tablet Take 0.5-1 mg by mouth 3 (three) times daily as needed for anxiety.   . Ascorbic Acid (VITAMIN C) 1000 MG  tablet Take 1 tablet by mouth daily.  Marland Kitchen aspirin 81 MG tablet Take 81 mg by mouth daily.  . baclofen (LIORESAL) 10 MG tablet Take 10 mg by mouth as needed for muscle spasms.  . Cholecalciferol (VITAMIN D3) 250 MCG (10000 UT) capsule Take 10,000 Units by mouth daily.  Marland Kitchen escitalopram (LEXAPRO) 5 MG tablet Take 5 mg by mouth every other day.  . fluticasone (FLONASE) 50 MCG/ACT nasal spray Place 1 spray into both nostrils as needed for allergies.   Marland Kitchen HYDROcodone-acetaminophen (NORCO) 10-325 MG tablet Take 1 tablet by mouth 3 (three) times daily as needed.  Marland Kitchen ibuprofen (ADVIL,MOTRIN) 200 MG tablet Take 600-800 mg by mouth every 6 (six) hours as needed (for pain, headaches, or cramps).  Marland Kitchen icosapent Ethyl (VASCEPA) 1 g capsule Take 2 capsules (2 g total) by mouth 2 (two) times daily.  . metoprolol tartrate (LOPRESSOR) 25 MG tablet Take 1 tablet by mouth twice daily  . Multiple Vitamin (MULTIVITAMIN) tablet Take 1 tablet by mouth daily.  . nitroGLYCERIN (NITROSTAT) 0.4 MG SL tablet DISSOLVE ONE TABLET UNDER THE TONGUE EVERY 5 MINUTES AS NEEDED FOR CHEST PAIN.  DO NOT EXCEED A TOTAL OF 3 DOSES IN 15 MINUTES  . omeprazole (PRILOSEC) 40 MG capsule Take 40 mg by mouth daily.  . ranitidine (ZANTAC) 150 MG capsule Take 150 mg by mouth as needed for heartburn.  Marland Kitchen REPATHA SURECLICK 140 MG/ML SOAJ INJECT 1 PEN INTO THE SKIIN EVERY 14 DAYS  . simvastatin (ZOCOR) 40 MG tablet Take 1 tablet (40 mg total) by mouth at bedtime.     Allergies:   Latex, Ciprofloxacin, and Amoxicillin   Social History   Tobacco Use  . Smoking status: Former Smoker    Types: Cigarettes    Quit date: 02/22/2018    Years since quitting: 2.6  . Smokeless tobacco: Never Used  Vaping Use  . Vaping Use: Never used  Substance Use Topics  . Alcohol use: No  . Drug use: No     Family Hx: The patient's family history includes CAD in her mother.  ROS:   Please see the history of present illness.     All other systems reviewed and  are negative.   Labs/Other Tests and Data Reviewed:    Recent Labs: 03/22/2020: ALT 16   Recent Lipid Panel Lab Results  Component Value Date/Time   CHOL 116 03/22/2020 04:00 PM   TRIG 96 03/22/2020 04:00 PM   HDL 75 03/22/2020 04:00 PM   CHOLHDL 1.5 03/22/2020 04:00 PM   CHOLHDL 3.4 02/26/2018 04:17 AM   LDLCALC 23 03/22/2020 04:00 PM    Wt Readings from Last 3 Encounters:  03/22/20 (!) 285 lb (129.3 kg)  05/24/19 270 lb 8 oz (122.7 kg)  03/20/19 264 lb 8 oz (120 kg)     Objective:    Vital Signs:  Ht 5\' 4"  (1.626 m)   BMI 48.92 kg/m  Well nourished, well developed female in no acute distress. Well appearing, alert and conversant, regular work of breathing,  good skin color  Eyes- anicteric mouth- oral mucosa is pink  neuro- grossly intact skin- no apparent rash or lesions or cyanosis   ASSESSMENT & PLAN:    1.  OSA  -She has not been using her device because she is a side sleeper and cannot sleep on her back.  When she tries to sleep in her side, her nasal pillow mask leaks and the hose gets wrapped around her -unfortunately she is not a candidate for the Hypoglossal nerve stimulator due to her morbid obesity -her OSA is severe and likely would not benefit from an oral device. -she has tried several different masks and the nasal pillow mask works the best -she has tried sleep pillows for OSA but they don't work either -I do not have much more to offer her at this time  2.  Morbid obesity -I have encouraged her to get into a routine exercise program and cut back on carbs and portions.    COVID-19 Education: The signs and symptoms of COVID-19 were discussed with the patient and how to seek care for testing (follow up with PCP or arrange E-visit).  The importance of social distancing was discussed today.  Patient Risk:   After full review of this patient's clinical status, I feel that they are at least moderate risk at this time.  Time:   Today, I have  spent 20 minutes on telemedicine discussing medical problems including OSA, Obesity and reviewing patient's chart including PAP compliance download.  Medication Adjustments/Labs and Tests Ordered: Current medicines are reviewed at length with the patient today.  Concerns regarding medicines are outlined above.  Tests Ordered: No orders of the defined types were placed in this encounter.  Medication Changes: No orders of the defined types were placed in this encounter.   Disposition:  Follow up in 1 year(s)  Signed, Armanda Magic, MD  11/01/2020 10:44 AM    [facility]

## 2020-12-10 ENCOUNTER — Other Ambulatory Visit: Payer: Self-pay | Admitting: Interventional Cardiology

## 2020-12-31 DIAGNOSIS — M5136 Other intervertebral disc degeneration, lumbar region: Secondary | ICD-10-CM | POA: Diagnosis not present

## 2020-12-31 DIAGNOSIS — M5412 Radiculopathy, cervical region: Secondary | ICD-10-CM | POA: Diagnosis not present

## 2021-01-08 DIAGNOSIS — R946 Abnormal results of thyroid function studies: Secondary | ICD-10-CM | POA: Diagnosis not present

## 2021-01-08 DIAGNOSIS — Z8616 Personal history of COVID-19: Secondary | ICD-10-CM | POA: Diagnosis not present

## 2021-03-27 ENCOUNTER — Other Ambulatory Visit: Payer: Self-pay | Admitting: Interventional Cardiology

## 2021-06-16 ENCOUNTER — Other Ambulatory Visit: Payer: Self-pay | Admitting: Interventional Cardiology

## 2021-06-17 DIAGNOSIS — N62 Hypertrophy of breast: Secondary | ICD-10-CM | POA: Diagnosis not present

## 2021-06-28 ENCOUNTER — Other Ambulatory Visit: Payer: Self-pay | Admitting: Interventional Cardiology

## 2021-07-10 DIAGNOSIS — E063 Autoimmune thyroiditis: Secondary | ICD-10-CM | POA: Diagnosis not present

## 2021-07-10 DIAGNOSIS — R5382 Chronic fatigue, unspecified: Secondary | ICD-10-CM | POA: Diagnosis not present

## 2021-07-14 ENCOUNTER — Other Ambulatory Visit: Payer: Self-pay | Admitting: Interventional Cardiology

## 2021-07-28 ENCOUNTER — Other Ambulatory Visit: Payer: Self-pay | Admitting: Interventional Cardiology

## 2021-07-29 ENCOUNTER — Other Ambulatory Visit: Payer: Self-pay

## 2021-07-29 ENCOUNTER — Telehealth: Payer: Self-pay | Admitting: Interventional Cardiology

## 2021-07-29 MED ORDER — ICOSAPENT ETHYL 1 G PO CAPS: 2.0000 g | ORAL_CAPSULE | Freq: Two times a day (BID) | ORAL | 1 refills | Status: DC

## 2021-07-29 MED ORDER — ICOSAPENT ETHYL 1 G PO CAPS: 2.0000 g | ORAL_CAPSULE | Freq: Two times a day (BID) | ORAL | 0 refills | Status: DC

## 2021-07-29 NOTE — Telephone Encounter (Signed)
Pt's medication was sent to pt's pharmacy as requested. Confirmation received.  °

## 2021-07-29 NOTE — Telephone Encounter (Signed)
*  STAT* If patient is at the pharmacy, call can be transferred to refill team.   1. Which medications need to be refilled? (please list name of each medication and dose if known) icosapent Ethyl (VASCEPA) 1 g capsule  2. Which pharmacy/location (including street and city if local pharmacy) is medication to be sent to?[facility] [address] [place], [place] - [address]  3. Do they need a 30 day or 90 day supply? 90   Patient is down to the last pill

## 2021-08-11 ENCOUNTER — Telehealth: Payer: Self-pay | Admitting: Interventional Cardiology

## 2021-08-11 MED ORDER — REPATHA SURECLICK 140 MG/ML ~~LOC~~ SOAJ: SUBCUTANEOUS | 11 refills | Status: DC

## 2021-08-11 NOTE — Telephone Encounter (Signed)
° ° °*  STAT* If patient is at the pharmacy, call can be transferred to refill team.   1. Which medications need to be refilled? (please list name of each medication and dose if known) REPATHA SURECLICK 140 MG/ML SOAJ  2. Which pharmacy/location (including street and city if local pharmacy) is medication to be sent to?[facility] [address] [place], [place] - [address]  3. Do they need a 30 day or 90 day supply? 90 days   Pt needs refill today because she needs to take it soon this week

## 2021-08-12 MED ORDER — REPATHA SURECLICK 140 MG/ML ~~LOC~~ SOAJ: SUBCUTANEOUS | 11 refills | Status: DC

## 2021-08-12 NOTE — Addendum Note (Signed)
Addended by: Rosalee Kaufman on: 08/12/2021 07:05 AM   Modules accepted: Orders

## 2021-09-09 ENCOUNTER — Ambulatory Visit (INDEPENDENT_AMBULATORY_CARE_PROVIDER_SITE_OTHER): Payer: BC Managed Care – PPO | Admitting: Plastic Surgery

## 2021-09-09 ENCOUNTER — Other Ambulatory Visit: Payer: Self-pay

## 2021-09-09 ENCOUNTER — Encounter: Payer: Self-pay | Admitting: Plastic Surgery

## 2021-09-09 ENCOUNTER — Telehealth: Payer: Self-pay

## 2021-09-09 DIAGNOSIS — N62 Hypertrophy of breast: Secondary | ICD-10-CM | POA: Diagnosis not present

## 2021-09-09 MED ORDER — ICOSAPENT ETHYL 1 G PO CAPS: 2.0000 g | ORAL_CAPSULE | Freq: Two times a day (BID) | ORAL | 1 refills | Status: DC

## 2021-09-09 NOTE — Telephone Encounter (Signed)
**Note De-Identified English Tomer Obfuscation** Vascepa PA started through covermymeds. Key: [id number]

## 2021-09-09 NOTE — Progress Notes (Signed)
Patient ID: Adrienne [name], female    DOB: 22-Nov-1970, 51 y.o.   MRN: [id number]   Chief Complaint  Patient presents with   Breast Problem    Mammary Hyperplasia: The patient is a 51 y.o. female with a history of mammary hyperplasia for several years.  She has extremely large breasts causing symptoms that include the following: Back pain in the upper and lower back, including neck pain. She pulls or pins her bra straps to provide better lift and relief of the pressure and pain. She notices relief by holding her breast up manually.  Her shoulder straps cause grooves and pain and pressure that requires padding for relief. Pain medication is sometimes required with motrin and tylenol.  Activities that are hindered by enlarged breasts include: exercise and running.  She has tried supportive clothing as well as fitted bras without improvement.  Her breasts are extremely large and fairly symmetric.  She has hyperpigmentation of the inframammary area on both sides.  The sternal to nipple distance on the right is 42 cm and the left is 42 cm.  The IMF distance is 16 cm.  She is 5 feet 4 inches tall and weighs 284 pounds.  The BMI = 48.7 kg/m.  Preoperative bra size = DDD/G cup. She would like to be a C/D cup.  The estimated excess breast tissue to be removed at the time of surgery = 900 grams on the left and 900 grams on the right.  Mammogram history: 2021 negative and 2022 is due.  Family history of breast cancer:  no.  Tobacco use:  positive but currently vaping.   The patient expresses the desire to pursue surgical intervention.  She has a history of cardiac disease with a heart attack and a stent placed.  Her cardiologist is at [facility].  She has seen physical therapist and a chiropractor in the past but not in the last 2 years.  She takes a baby aspirin daily.  She has seen an orthopedist who referred her to us.    Review of Systems  Constitutional: Negative.   Eyes: Negative.   Respiratory:  Negative.    Cardiovascular: Negative.   Gastrointestinal: Negative.   Endocrine: Negative.   Musculoskeletal:  Positive for back pain and neck pain.  Skin: Negative.   Psychiatric/Behavioral: Negative.     Past Medical History:  Diagnosis Date   CAD (coronary artery disease) 03/18/2018   S/p NSTEMI 7/19: LHC - oLAD 50, mLCx 50, oRCA 90 >> PCI:  [facility] Echo 7/19:  Mild concentric LVH, EF 65-70, normal wall motion, normal diastolic function, trivial TR   Coronary artery disease    GERD (gastroesophageal reflux disease)    Hyperlipidemia    Morbid obesity (HCC) 07/29/2018   OSA (obstructive sleep apnea) 07/29/2018   Severe OSA with an AHI of 48/hr and no central sleep apnea.  Her O2 sats dropped to 77% with respiratory events. She is now on auto CPAP    Past Surgical History:  Procedure Laterality Date   CARDIAC CATHETERIZATION  02/25/2018   CESAREAN SECTION     CORONARY STENT INTERVENTION N/A 02/25/2018   Procedure: CORONARY STENT INTERVENTION;  Surgeon: Tonny Bollmanooper, Michael, MD;  [facility];  Service: Cardiovascular;  Laterality: N/A;   finger surgery     LEFT HEART CATH AND CORONARY ANGIOGRAPHY N/A 02/23/2018   Procedure: LEFT HEART CATH AND CORONARY ANGIOGRAPHY;  Surgeon: Kathleene HazelMcAlhany, Christopher D, MD;  [facility];  Service: Cardiovascular;  Laterality: N/A;      Current Outpatient Medications:    ALPRAZolam (XANAX) 1 MG tablet, Take 0.5-1 mg by mouth 3 (three) times daily as needed for anxiety. , Disp: , Rfl: 0   Ascorbic Acid (VITAMIN C) 1000 MG tablet, Take 1 tablet by mouth daily., Disp: , Rfl:    aspirin 81 MG tablet, Take 81 mg by mouth daily., Disp: , Rfl:    baclofen (LIORESAL) 10 MG tablet, Take 10 mg by mouth as needed for muscle spasms., Disp: , Rfl:    Cholecalciferol (VITAMIN D3) 250 MCG (10000 UT) capsule, Take 10,000 Units by mouth daily., Disp: , Rfl:    escitalopram (LEXAPRO) 5 MG tablet, Take 5 mg by mouth every other day., Disp:  , Rfl:    Evolocumab (REPATHA SURECLICK) 140 MG/ML SOAJ, INJECT 1 PEN INTO THE SKIIN EVERY 14 DAYS, Disp: 2 mL, Rfl: 11   fluticasone (FLONASE) 50 MCG/ACT nasal spray, Place 1 spray into both nostrils as needed for allergies. , Disp: , Rfl:    HYDROcodone-acetaminophen (NORCO) 10-325 MG tablet, Take 1 tablet by mouth 3 (three) times daily as needed., Disp: , Rfl:    ibuprofen (ADVIL,MOTRIN) 200 MG tablet, Take 600-800 mg by mouth every 6 (six) hours as needed (for pain, headaches, or cramps)., Disp: , Rfl:    icosapent Ethyl (VASCEPA) 1 g capsule, Take 2 capsules (2 g total) by mouth 2 (two) times daily. Please keep upcoming appt with Dr. Katrinka Blazing in April 2023 before anymore refills. Thank you Final attempt, Disp: 360 capsule, Rfl: 1   metoprolol tartrate (LOPRESSOR) 25 MG tablet, Take 1 tablet by mouth twice daily, Disp: 180 tablet, Rfl: 3   Multiple Vitamin (MULTIVITAMIN) tablet, Take 1 tablet by mouth daily., Disp: , Rfl:    nitroGLYCERIN (NITROSTAT) 0.4 MG SL tablet, DISSOLVE ONE TABLET UNDER THE TONGUE EVERY 5 MINUTES AS NEEDED FOR CHEST PAIN.  DO NOT EXCEED A TOTAL OF 3 DOSES IN 15 MINUTES, Disp: 25 tablet, Rfl: 6   omeprazole (PRILOSEC) 40 MG capsule, Take 40 mg by mouth daily., Disp: , Rfl: 2   ranitidine (ZANTAC) 150 MG capsule, Take 150 mg by mouth as needed for heartburn., Disp: , Rfl:    simvastatin (ZOCOR) 40 MG tablet, TAKE 1 TABLET BY MOUTH AT BEDTIME *REPLACES  ROSUVASTATIN*, Disp: 30 tablet, Rfl: 11   Objective:   There were no vitals filed for this visit.  Physical Exam Vitals reviewed.  Constitutional:      Appearance: Normal appearance.  HENT:     Head: Normocephalic and atraumatic.  Cardiovascular:     Rate and Rhythm: Normal rate.     Pulses: Normal pulses.  Pulmonary:     Effort: Pulmonary effort is normal. No respiratory distress.  Abdominal:     General: There is no distension.     Palpations: Abdomen is soft.     Tenderness: There is no abdominal tenderness.   Musculoskeletal:        General: No swelling or deformity.  Skin:    General: Skin is warm.     Capillary Refill: Capillary refill takes less than 2 seconds.     Coloration: Skin is not jaundiced.     Findings: No bruising.  Neurological:     General: No focal deficit present.     Mental Status: She is alert and oriented to person, place, and time.  Psychiatric:        Mood and Affect:  Mood normal.        Behavior: Behavior normal.        Thought Content: Thought content normal.    Assessment & Plan:  Symptomatic mammary hypertrophy  The procedure the patient selected and that was best for the patient was discussed. The risk were discussed and include but not limited to the following:  Breast asymmetry, fluid accumulation, firmness of the breast, inability to breast feed, loss of nipple or areola, skin loss, change in skin and nipple sensation, fat necrosis of the breast tissue, bleeding, infection and healing delay.  There are risks of anesthesia and injury to nerves or blood vessels.  Allergic reaction to tape, suture and skin glue are possible.  There will be swelling.  Any of these can lead to the need for revisional surgery.  A breast reduction has potential to interfere with diagnostic procedures in the future.  This procedure is best done when the breast is fully developed.  Changes in the breast will continue to occur over time: pregnancy, weight gain or weigh loss.    Total time: 40 minutes. This includes time spent with the patient during the visit as well as time spent before and after the visit reviewing the chart, documenting the encounter and ordering pertinent studies. and literature emailed to the patient.   Physical therapy: Ordered Mammogram: Ordered   She will need cardiac clearance before any surgery.  She will also need to stop vaping and decrease her BMI. She is a candidate for bilateral breast reduction.  She is planning on losing some weight.  I would like to see  her back in the next 6 months. Pictures were obtained of the patient and placed in the chart with the patient's or guardian's permission.    Alena Bills Rande Dario, DO

## 2021-10-02 ENCOUNTER — Ambulatory Visit
Admission: RE | Admit: 2021-10-02 | Discharge: 2021-10-02 | Disposition: A | Discharge status: Home / Self Care | Payer: BC Managed Care – PPO | Source: Ambulatory Visit | Attending: Plastic Surgery | Admitting: Plastic Surgery

## 2021-10-02 ENCOUNTER — Other Ambulatory Visit: Payer: Self-pay

## 2021-10-02 DIAGNOSIS — Z1231 Encounter for screening mammogram for malignant neoplasm of breast: Secondary | ICD-10-CM | POA: Diagnosis not present

## 2021-10-02 DIAGNOSIS — N62 Hypertrophy of breast: Secondary | ICD-10-CM

## 2021-10-16 DIAGNOSIS — Z841 Family history of disorders of kidney and ureter: Secondary | ICD-10-CM | POA: Diagnosis not present

## 2021-10-16 DIAGNOSIS — E279 Disorder of adrenal gland, unspecified: Secondary | ICD-10-CM | POA: Diagnosis not present

## 2021-10-16 DIAGNOSIS — N132 Hydronephrosis with renal and ureteral calculous obstruction: Secondary | ICD-10-CM | POA: Diagnosis not present

## 2021-10-16 DIAGNOSIS — M549 Dorsalgia, unspecified: Secondary | ICD-10-CM | POA: Diagnosis not present

## 2021-10-16 DIAGNOSIS — R109 Unspecified abdominal pain: Secondary | ICD-10-CM | POA: Diagnosis not present

## 2021-11-04 DIAGNOSIS — Z79899 Other long term (current) drug therapy: Secondary | ICD-10-CM | POA: Diagnosis not present

## 2021-11-04 DIAGNOSIS — M545 Low back pain, unspecified: Secondary | ICD-10-CM | POA: Diagnosis not present

## 2021-11-04 DIAGNOSIS — M542 Cervicalgia: Secondary | ICD-10-CM | POA: Diagnosis not present

## 2021-11-04 DIAGNOSIS — Z5181 Encounter for therapeutic drug level monitoring: Secondary | ICD-10-CM | POA: Diagnosis not present

## 2021-11-04 DIAGNOSIS — M5136 Other intervertebral disc degeneration, lumbar region: Secondary | ICD-10-CM | POA: Diagnosis not present

## 2021-11-07 DIAGNOSIS — F411 Generalized anxiety disorder: Secondary | ICD-10-CM | POA: Diagnosis not present

## 2021-11-07 DIAGNOSIS — D1779 Benign lipomatous neoplasm of other sites: Secondary | ICD-10-CM | POA: Diagnosis not present

## 2021-11-07 DIAGNOSIS — N83209 Unspecified ovarian cyst, unspecified side: Secondary | ICD-10-CM | POA: Diagnosis not present

## 2021-11-07 DIAGNOSIS — N200 Calculus of kidney: Secondary | ICD-10-CM | POA: Diagnosis not present

## 2021-11-14 DIAGNOSIS — M47896 Other spondylosis, lumbar region: Secondary | ICD-10-CM | POA: Diagnosis not present

## 2021-11-14 DIAGNOSIS — M542 Cervicalgia: Secondary | ICD-10-CM | POA: Diagnosis not present

## 2021-11-20 DIAGNOSIS — G894 Chronic pain syndrome: Secondary | ICD-10-CM | POA: Diagnosis not present

## 2021-12-02 NOTE — Progress Notes (Signed)
?Cardiology Office Note:   ? ?Date:  12/03/2021  ? ?ID:  Adrienne [name], DOB 1970-12-19, MRN [id number] ? ?PCP:  Eartha Inch, MD  ?Cardiologist:  Lesleigh Noe, MD  ? ?Referring MD: Eartha Inch, MD  ? ?Chief Complaint  ?Patient presents with  ? Coronary Artery Disease  ? Hypertension  ? Hyperlipidemia  ? ? ?History of Present Illness:   ? ?Adrienne [name] is a 51 y.o. female with a hx of OSA on CPAP, non-STEMI, CAD with ostial RCA stent 02/2018, morbid obesity, and hyperlipidemia. ? ? ?Rare intermittent chest discomfort.  Not exertional and random.  Has had significant medical issues since the last time I saw her they will include chronic lumbar and cervical pain, kidney stone, and identification of an adrenal mass and ovarian cyst that need to be worked up. ? ?Denies exertional related chest discomfort.  No nitroglycerin use.  No difficulty with breathing or orthopnea.  Lower extremity enlargement but not erythematous based upon exam and absence of pitting. ? ?Past Medical History:  ?Diagnosis Date  ? CAD (coronary artery disease) 03/18/2018  ? S/p NSTEMI 7/19: LHC - oLAD 50, mLCx 50, oRCA 90 >> PCI:  DES to [facility] // Echo 7/19:  Mild concentric LVH, EF 65-70, normal wall motion, normal diastolic function, trivial TR  ? Coronary artery disease   ? GERD (gastroesophageal reflux disease)   ? Hyperlipidemia   ? Morbid obesity (HCC) 07/29/2018  ? OSA (obstructive sleep apnea) 07/29/2018  ? Severe OSA with an AHI of 48/hr and no central sleep apnea.  Her O2 sats dropped to 77% with respiratory events. She is now on auto CPAP  ? ? ?Past Surgical History:  ?Procedure Laterality Date  ? CARDIAC CATHETERIZATION  02/25/2018  ? CESAREAN SECTION    ? CORONARY STENT INTERVENTION N/A 02/25/2018  ? Procedure: CORONARY STENT INTERVENTION;  Surgeon: Tonny Bollman, MD;  Location: [facility] INVASIVE CV LAB;  Service: Cardiovascular;  Laterality: N/A;  ? finger surgery    ? LEFT HEART CATH AND CORONARY ANGIOGRAPHY N/A 02/23/2018   ? Procedure: LEFT HEART CATH AND CORONARY ANGIOGRAPHY;  Surgeon: Kathleene Hazel, MD;  Location: [facility] INVASIVE CV LAB;  Service: Cardiovascular;  Laterality: N/A;  ? ? ?Current Medications: ?Current Meds  ?Medication Sig  ? ALPRAZolam (XANAX) 1 MG tablet Take 0.5-1 mg by mouth 3 (three) times daily as needed for anxiety.   ? Ascorbic Acid (VITAMIN C) 1000 MG tablet Take 1 tablet by mouth daily.  ? aspirin 81 MG tablet Take 81 mg by mouth daily.  ? Cholecalciferol (VITAMIN D3) 250 MCG (10000 UT) capsule Take 10,000 Units by mouth daily.  ? escitalopram (LEXAPRO) 5 MG tablet Take 5 mg by mouth every other day.  ? Evolocumab (REPATHA SURECLICK) 140 MG/ML SOAJ INJECT 1 PEN INTO THE SKIIN EVERY 14 DAYS  ? fluticasone (FLONASE) 50 MCG/ACT nasal spray Place 1 spray into both nostrils as needed for allergies.   ? HYDROcodone-acetaminophen (NORCO) 10-325 MG tablet Take 1 tablet by mouth 3 (three) times daily as needed.  ? ibuprofen (ADVIL,MOTRIN) 200 MG tablet Take 600-800 mg by mouth every 6 (six) hours as needed (for pain, headaches, or cramps).  ? icosapent Ethyl (VASCEPA) 1 g capsule Take 2 capsules (2 g total) by mouth 2 (two) times daily. Please keep upcoming appt with Dr. Katrinka Blazing in April 2023 before anymore refills. Thank you Final attempt  ? metoprolol tartrate (LOPRESSOR) 25 MG tablet Take 1 tablet by mouth  twice daily  ? Multiple Vitamin (MULTIVITAMIN) tablet Take 1 tablet by mouth daily.  ? nitroGLYCERIN (NITROSTAT) 0.4 MG SL tablet DISSOLVE ONE TABLET UNDER THE TONGUE EVERY 5 MINUTES AS NEEDED FOR CHEST PAIN.  DO NOT EXCEED A TOTAL OF 3 DOSES IN 15 MINUTES  ? omeprazole (PRILOSEC) 40 MG capsule Take 40 mg by mouth daily.  ? oxyCODONE (OXY IR/ROXICODONE) 5 MG immediate release tablet Take 5 mg by mouth as needed.  ? ranitidine (ZANTAC) 150 MG capsule Take 150 mg by mouth as needed for heartburn.  ? simvastatin (ZOCOR) 40 MG tablet TAKE 1 TABLET BY MOUTH AT BEDTIME *REPLACES  ROSUVASTATIN*  ?  ? ?Allergies:    Latex, Ciprofloxacin, and Amoxicillin  ? ?Social History  ? ?Socioeconomic History  ? Marital status: Married  ?  Spouse name: Not on file  ? Number of children: Not on file  ? Years of education: Not on file  ? Highest education level: Not on file  ?Occupational History  ? Not on file  ?Tobacco Use  ? Smoking status: Former  ?  Types: Cigarettes  ?  Quit date: 02/22/2018  ?  Years since quitting: 3.7  ? Smokeless tobacco: Never  ?Vaping Use  ? Vaping Use: Never used  ?Substance and Sexual Activity  ? Alcohol use: No  ? Drug use: No  ? Sexual activity: Not on file  ?Other Topics Concern  ? Not on file  ?Social History Narrative  ? Not on file  ? ?Social Determinants of Health  ? ?Financial Resource Strain: Not on file  ?Food Insecurity: Not on file  ?Transportation Needs: Not on file  ?Physical Activity: Not on file  ?Stress: Not on file  ?Social Connections: Not on file  ?  ? ?Family History: ?The patient's family history includes CAD in her mother. ? ?ROS:   ?Please see the history of present illness.    ?Chronic back and neck pain.  Anxiety.  No recent blood work.  All other systems reviewed and are negative. ? ?EKGs/Labs/Other Studies Reviewed:   ? ?The following studies were reviewed today: ? ?CAD Coronary Angiography 2019: ?Diagnostic ?Dominance: Right ?Intervention ? ? ? ?EKG:  EKG normal sinus rhythm with nonspecific T wave flattening.  No change compared to prior.  Personally reviewed on 12/03/2021. ? ?Recent Labs: ?No results found for requested labs within last 8760 hours.  ?Recent Lipid Panel ?   ?Component Value Date/Time  ? CHOL 116 03/22/2020 1600  ? TRIG 96 03/22/2020 1600  ? HDL 75 03/22/2020 1600  ? CHOLHDL 1.5 03/22/2020 1600  ? CHOLHDL 3.4 02/26/2018 0417  ? VLDL 28 02/26/2018 0417  ? LDLCALC 23 03/22/2020 1600  ? ? ?Physical Exam:   ? ?VS:  BP 110/78   Pulse 68   Ht 5\' 4"  (1.626 m)   Wt 266 lb 12.8 oz (121 kg)   SpO2 96%   BMI 45.80 kg/m?    ? ?Wt Readings from Last 3 Encounters:  ?12/03/21  266 lb 12.8 oz (121 kg)  ?09/09/21 175 lb 12.8 oz (79.7 kg)  ?03/22/20 (!) 285 lb (129.3 kg)  ?  ? ?GEN: Morbid. No acute distress ?HEENT: Normal ?NECK: No JVD. ?LYMPHATICS: No lymphadenopathy ?CARDIAC: No murmur. RRR no gallop, or edema. ?VASCULAR:  Normal Pulses. No bruits. ?RESPIRATORY:  Clear to auscultation without rales, wheezing or rhonchi  ?ABDOMEN: Soft, non-tender, non-distended, No pulsatile mass, ?MUSCULOSKELETAL: No deformity  ?SKIN: Warm and dry ?NEUROLOGIC:  Alert and oriented x 3 ?PSYCHIATRIC:  Normal affect  ? ?ASSESSMENT:   ? ?1. Coronary artery disease involving native coronary artery of native heart without angina pectoris   ?2. OSA (obstructive sleep apnea)   ?3. Mixed hyperlipidemia   ?4. Tobacco abuse   ?5. Morbid obesity (HCC)   ? ?PLAN:   ? ?In order of problems listed above: ? ?Secondary prevention discussed.  Aerobic activity encouraged. ?CPAP encouraged. ?Continue Repatha, Vascepa, and Zocor.  Last LDL was less than 50.  Has not been checked in 2 years. ?Discontinued cigarette smoking ?Weight is stable and classifies her as super morbid obesity. ? ?Overall education and awareness concerning secondary risk prevention was discussed in detail: LDL less than 70, hemoglobin A1c less than 7, blood pressure target less than 130/80 mmHg, >150 minutes of moderate aerobic activity per week, avoidance of smoking, weight control (via diet and exercise), and continued surveillance/management of/for obstructive sleep apnea. ? ? ? ? ?Medication Adjustments/Labs and Tests Ordered: ?Current medicines are reviewed at length with the patient today.  Concerns regarding medicines are outlined above.  ?Orders Placed This Encounter  ?Procedures  ? Hepatic function panel  ? Lipid panel  ? HgB A1c  ? EKG 12-Lead  ? ?No orders of the defined types were placed in this encounter. ? ? ?Patient Instructions  ?Medication Instructions:  ?Your physician recommends that you continue on your current medications as  directed. Please refer to the Current Medication list given to you today. ? ?*If you need a refill on your cardiac medications before your next appointment, please call your pharmacy* ? ? ?Lab Work: ?Lipid, Liver an

## 2021-12-03 ENCOUNTER — Encounter: Payer: Self-pay | Admitting: Interventional Cardiology

## 2021-12-03 ENCOUNTER — Ambulatory Visit (INDEPENDENT_AMBULATORY_CARE_PROVIDER_SITE_OTHER): Payer: BC Managed Care – PPO | Admitting: Interventional Cardiology

## 2021-12-03 VITALS — BP 110/78 | HR 68 | Ht 64.0 in | Wt 266.8 lb

## 2021-12-03 DIAGNOSIS — I2510 Atherosclerotic heart disease of native coronary artery without angina pectoris: Secondary | ICD-10-CM

## 2021-12-03 DIAGNOSIS — Z720 Tobacco use: Secondary | ICD-10-CM

## 2021-12-03 DIAGNOSIS — G4733 Obstructive sleep apnea (adult) (pediatric): Secondary | ICD-10-CM

## 2021-12-03 DIAGNOSIS — E782 Mixed hyperlipidemia: Secondary | ICD-10-CM | POA: Diagnosis not present

## 2021-12-03 NOTE — Patient Instructions (Signed)
Medication Instructions:  ?Your physician recommends that you continue on your current medications as directed. Please refer to the Current Medication list given to you today. ? ?*If you need a refill on your cardiac medications before your next appointment, please call your pharmacy* ? ? ?Lab Work: ?Lipid, Liver and A1C when available.  You will need to be fasting for these labs (nothing to eat or drink after midnight except water and black coffee). ? ?If you have labs (blood work) drawn today and your tests are completely normal, you will receive your results only by: ?MyChart Message (if you have MyChart) OR ?A paper copy in the mail ?If you have any lab test that is abnormal or we need to change your treatment, we will call you to review the results. ? ? ?Testing/Procedures: ?None ? ? ?Follow-Up: ?At [facility], you and your health needs are our priority.  As part of our continuing mission to provide you with exceptional heart care, we have created designated Provider Care Teams.  These Care Teams include your primary Cardiologist (physician) and Advanced Practice Providers (APPs -  Physician Assistants and Nurse Practitioners) who all work together to provide you with the care you need, when you need it. ? ?We recommend signing up for the patient portal called "MyChart".  Sign up information is provided on this After Visit Summary.  MyChart is used to connect with patients for Virtual Visits (Telemedicine).  Patients are able to view lab/test results, encounter notes, upcoming appointments, etc.  Non-urgent messages can be sent to your provider as well.   ?To learn more about what you can do with MyChart, go to [email or web address].   ? ?Your next appointment:   ?9-12 month(s) ? ?The format for your next appointment:   ?In Person ? ?Provider:   ?Lesleigh Noe, MD   ? ? ?Other Instructions ? ? ?Important Information About Sugar ? ? ? ? ?  ?

## 2021-12-05 ENCOUNTER — Other Ambulatory Visit: Payer: Self-pay | Admitting: Interventional Cardiology

## 2021-12-08 ENCOUNTER — Other Ambulatory Visit: Payer: Self-pay | Admitting: Interventional Cardiology

## 2021-12-12 ENCOUNTER — Encounter: Payer: Self-pay | Admitting: *Deleted

## 2021-12-16 DIAGNOSIS — N200 Calculus of kidney: Secondary | ICD-10-CM | POA: Diagnosis not present

## 2021-12-16 DIAGNOSIS — E278 Other specified disorders of adrenal gland: Secondary | ICD-10-CM | POA: Diagnosis not present

## 2021-12-18 DIAGNOSIS — D252 Subserosal leiomyoma of uterus: Secondary | ICD-10-CM | POA: Diagnosis not present

## 2021-12-23 ENCOUNTER — Other Ambulatory Visit: Payer: Self-pay

## 2021-12-23 MED ORDER — ICOSAPENT ETHYL 1 G PO CAPS: 2.0000 g | ORAL_CAPSULE | Freq: Two times a day (BID) | ORAL | 3 refills | Status: DC

## 2021-12-29 NOTE — Telephone Encounter (Signed)
Received a fax from [facility] stating pt needs P/A for Icosapent 1GM. Per Covermymeds, Vascepa was approved on 09/09/21 - 09/08/22. ?I called [employer] and they said the Insurance will cover Vascepa but not the generic. The pt picked up a #90 day supply of Vascepa on 12/26/21. ?

## 2022-01-05 ENCOUNTER — Other Ambulatory Visit: Payer: Self-pay | Admitting: Interventional Cardiology

## 2022-01-08 ENCOUNTER — Other Ambulatory Visit: Payer: BC Managed Care – PPO

## 2022-01-08 DIAGNOSIS — E782 Mixed hyperlipidemia: Secondary | ICD-10-CM

## 2022-01-08 DIAGNOSIS — I2510 Atherosclerotic heart disease of native coronary artery without angina pectoris: Secondary | ICD-10-CM

## 2022-01-09 LAB — LIPID PANEL
Chol/HDL Ratio: 1.7 ratio (ref 0.0–4.4)
Cholesterol, Total: 106 mg/dL (ref 100–199)
HDL: 64 mg/dL (ref >39)
LDL Chol Calc (NIH): 27 mg/dL (ref 0–99)
Triglycerides: 68 mg/dL (ref 0–149)
VLDL Cholesterol Cal: 15 mg/dL (ref 5–40)

## 2022-01-09 LAB — HEPATIC FUNCTION PANEL
ALT: 21 IU/L (ref 0–32)
AST: 19 IU/L (ref 0–40)
Albumin: 4.2 g/dL (ref 3.8–4.9)
Alkaline Phosphatase: 98 IU/L (ref 44–121)
Bilirubin Total: 0.4 mg/dL (ref 0.0–1.2)
Bilirubin, Direct: <0.1 mg/dL (ref 0.00–0.40)
Total Protein: 6.6 g/dL (ref 6.0–8.5)

## 2022-01-09 LAB — HEMOGLOBIN A1C
Est. average glucose Bld gHb Est-mCnc: 117 mg/dL
Hgb A1c MFr Bld: 5.7 % — ABNORMAL HIGH (ref 4.8–5.6)

## 2022-01-12 ENCOUNTER — Telehealth: Payer: Self-pay

## 2022-01-12 NOTE — Telephone Encounter (Signed)
Spoke with patient and discussed lab results.  Per Dr. Katrinka Blazing: Let the patient know she is pre-diabetic. Cut back on carbohydrates in diet.  Patient states she has changed her diet since having a kidney stone in February 2023. She reports cutting back on bread and soda, states she has been hydrating with water. She does report drinking approximately 5-6 oz of fruit juice each day to "have a break from all the water."  Will send a copy to PCP Dr. Cyndia Bent to review.   Patient asked if she would be a candidate for Ozempic or something similar. She also asked if it would be OK with Dr. Katrinka Blazing if she took ibuprofen for her back pain.  Will forward to Dr. Katrinka Blazing to review and advise.

## 2022-01-12 NOTE — Telephone Encounter (Signed)
-----   Message from Lyn Records, MD sent at 01/12/2022 11:20 AM EDT ----- Let the patient know she is pre-diabetic. Cut back on carbohydrates in diet. A copy will be sent to Patrcia Dolly, DO

## 2022-01-23 DIAGNOSIS — L0102 Bockhart's impetigo: Secondary | ICD-10-CM | POA: Diagnosis not present

## 2022-01-23 DIAGNOSIS — L732 Hidradenitis suppurativa: Secondary | ICD-10-CM | POA: Diagnosis not present

## 2022-01-23 NOTE — Telephone Encounter (Signed)
Insurance will not cover Ozempic without a diagnosis of diabetes. Could see if Reginal Lutes is covered on her formulary (still the same medication semaglutide, just different dosing available for weight loss), however this is now on national backorder and will not be available through September. Would recommend that pt reach back out to discuss in October at the earliest when shortage is expected to resolve and medication will be in supply again, and can look into [place] coverage at that time.

## 2022-01-23 NOTE — Telephone Encounter (Signed)
Left message for patient to let her know Dr. Katrinka Blazing said do not use Ibuprofen on a regular basis. Also that he is agreeable to Ozempic and we are working on getting her in to speak with a Pharmacist about starting this. Provided office number for callback if any questions.

## 2022-02-11 DIAGNOSIS — Z1212 Encounter for screening for malignant neoplasm of rectum: Secondary | ICD-10-CM | POA: Diagnosis not present

## 2022-02-11 DIAGNOSIS — Z1211 Encounter for screening for malignant neoplasm of colon: Secondary | ICD-10-CM | POA: Diagnosis not present

## 2022-02-19 DIAGNOSIS — M5416 Radiculopathy, lumbar region: Secondary | ICD-10-CM | POA: Diagnosis not present

## 2022-02-19 DIAGNOSIS — G894 Chronic pain syndrome: Secondary | ICD-10-CM | POA: Diagnosis not present

## 2022-02-19 DIAGNOSIS — M5136 Other intervertebral disc degeneration, lumbar region: Secondary | ICD-10-CM | POA: Diagnosis not present

## 2022-02-23 LAB — COLOGUARD: COLOGUARD: NEGATIVE

## 2022-02-26 ENCOUNTER — Telehealth: Payer: Self-pay

## 2022-02-26 NOTE — Telephone Encounter (Signed)
Insurance will not cover Ozempic or Mounjaro with a diagnosis of prediabetes unfortunately.

## 2022-02-26 NOTE — Telephone Encounter (Signed)
Received call from patient requesting update on Ozempic.  Per last message via MyChart (read by patient on 01/24/22):   Insurance will not cover Ozempic without a diagnosis of diabetes. Could see if Reginal Lutes is covered on her formulary (still the same medication semaglutide, just different dosing available for weight loss), however this is now on national backorder and will not be available through September. Would recommend that pt reach back out to discuss in October at the earliest when shortage is expected to resolve and medication will be in supply again, and can look into [place] coverage at that time.   So if you're still interested, reach back out to [place] in October and we can go from there.   Patient asked if having a diagnosis of pre-diabetes would qualify her for Ozempic. She also asked about Mounjaro and if this would help lower her A1c as well as with weight loss, she asks if this is another option for which she might qualify.  Will forward to our Pharmacists and Dr. Katrinka Blazing for review.

## 2022-03-03 ENCOUNTER — Telehealth: Payer: Self-pay | Admitting: Interventional Cardiology

## 2022-03-03 NOTE — Telephone Encounter (Signed)
Patient want to take a multivitamin collagen supplement and is checking if it will affect her heart medication.  Please advise.

## 2022-03-03 NOTE — Telephone Encounter (Signed)
Returned call to patient who is asking if it is okay for her to take a collagen supplement to help with hair thinning and skin elasticity.  Informed patient that there aren't any contraindications to taking a collagen supplement from a cardiac standpoint.  Patient verbalized understanding and expressed appreciation for call.

## 2022-03-03 NOTE — Telephone Encounter (Signed)
Spoke with patient and informed her that insurance will not cover Ozempic or Mounjaro for dx of prediabetes. Advised patient to contact insurance company to ask them if they cover [facility] and if so we can explore that option in October when it is hopefully off backorder.  Patient verbalized understanding.

## 2022-03-10 ENCOUNTER — Ambulatory Visit: Payer: BC Managed Care – PPO | Admitting: Plastic Surgery

## 2022-03-22 ENCOUNTER — Other Ambulatory Visit: Payer: Self-pay | Admitting: Interventional Cardiology

## 2022-03-27 DIAGNOSIS — M47816 Spondylosis without myelopathy or radiculopathy, lumbar region: Secondary | ICD-10-CM | POA: Diagnosis not present

## 2022-03-27 DIAGNOSIS — R21 Rash and other nonspecific skin eruption: Secondary | ICD-10-CM | POA: Diagnosis not present

## 2022-03-27 DIAGNOSIS — M130 Polyarthritis, unspecified: Secondary | ICD-10-CM | POA: Diagnosis not present

## 2022-03-27 DIAGNOSIS — G8929 Other chronic pain: Secondary | ICD-10-CM | POA: Diagnosis not present

## 2022-03-27 DIAGNOSIS — L732 Hidradenitis suppurativa: Secondary | ICD-10-CM | POA: Diagnosis not present

## 2022-03-27 DIAGNOSIS — M25572 Pain in left ankle and joints of left foot: Secondary | ICD-10-CM | POA: Diagnosis not present

## 2022-03-31 ENCOUNTER — Other Ambulatory Visit: Payer: Self-pay | Admitting: Pharmacist

## 2022-03-31 MED ORDER — REPATHA SURECLICK 140 MG/ML ~~LOC~~ SOAJ: SUBCUTANEOUS | 11 refills | Status: DC

## 2022-04-03 ENCOUNTER — Other Ambulatory Visit: Payer: Self-pay

## 2022-04-03 ENCOUNTER — Encounter (HOSPITAL_BASED_OUTPATIENT_CLINIC_OR_DEPARTMENT_OTHER): Payer: Self-pay

## 2022-04-03 DIAGNOSIS — N201 Calculus of ureter: Secondary | ICD-10-CM | POA: Insufficient documentation

## 2022-04-03 DIAGNOSIS — Z91040 Latex allergy status: Secondary | ICD-10-CM | POA: Insufficient documentation

## 2022-04-03 DIAGNOSIS — D72829 Elevated white blood cell count, unspecified: Secondary | ICD-10-CM | POA: Diagnosis not present

## 2022-04-03 DIAGNOSIS — Z7982 Long term (current) use of aspirin: Secondary | ICD-10-CM | POA: Diagnosis not present

## 2022-04-03 DIAGNOSIS — R109 Unspecified abdominal pain: Secondary | ICD-10-CM | POA: Diagnosis not present

## 2022-04-03 LAB — CBC
HCT: 42.7 % (ref 36.0–46.0)
Hemoglobin: 14.3 g/dL (ref 12.0–15.0)
MCH: 30.6 pg (ref 26.0–34.0)
MCHC: 33.5 g/dL (ref 30.0–36.0)
MCV: 91.2 fL (ref 80.0–100.0)
Platelets: 257 10*3/uL (ref 150–400)
RBC: 4.68 MIL/uL (ref 3.87–5.11)
RDW: 13.3 % (ref 11.5–15.5)
WBC: 13.5 10*3/uL — ABNORMAL HIGH (ref 4.0–10.5)
nRBC: 0 % (ref 0.0–0.2)

## 2022-04-03 LAB — URINALYSIS, ROUTINE W REFLEX MICROSCOPIC
Bilirubin Urine: NEGATIVE
Glucose, UA: NEGATIVE mg/dL
Ketones, ur: NEGATIVE mg/dL
Leukocytes,Ua: NEGATIVE
Nitrite: NEGATIVE
Protein, ur: NEGATIVE mg/dL
Specific Gravity, Urine: 1.02 (ref 1.005–1.030)
pH: 6 (ref 5.0–8.0)

## 2022-04-03 LAB — BASIC METABOLIC PANEL
Anion gap: 10 (ref 5–15)
BUN: 14 mg/dL (ref 6–20)
CO2: 23 mmol/L (ref 22–32)
Calcium: 9.2 mg/dL (ref 8.9–10.3)
Chloride: 101 mmol/L (ref 98–111)
Creatinine, Ser: 0.9 mg/dL (ref 0.44–1.00)
GFR, Estimated: >60 mL/min (ref >60)
Glucose, Bld: 118 mg/dL — ABNORMAL HIGH (ref 70–99)
Potassium: 4 mmol/L (ref 3.5–5.1)
Sodium: 134 mmol/L — ABNORMAL LOW (ref 135–145)

## 2022-04-03 LAB — PREGNANCY, URINE: Preg Test, Ur: NEGATIVE

## 2022-04-03 LAB — URINALYSIS, MICROSCOPIC (REFLEX)

## 2022-04-03 NOTE — ED Triage Notes (Signed)
Patient here POV from Home.  Endorses being Diagnosed with a Renal Stone in February that urologist believes the patient passed.  States she has had Left Flank Pain that radiates around ABD and toward Pelvic Area. Since 1200 Today.  No Discernable Dysuria. Associated N/V. No Diarrhea. No Known Fevers.   NAD Noted during Triage. A&Ox4. Ambulatory.

## 2022-04-04 ENCOUNTER — Emergency Department (HOSPITAL_BASED_OUTPATIENT_CLINIC_OR_DEPARTMENT_OTHER)
Admission: EM | Admit: 2022-04-04 | Discharge: 2022-04-04 | Disposition: A | Discharge status: Home / Self Care | Payer: BC Managed Care – PPO | Attending: Emergency Medicine | Admitting: Emergency Medicine

## 2022-04-04 ENCOUNTER — Emergency Department (HOSPITAL_BASED_OUTPATIENT_CLINIC_OR_DEPARTMENT_OTHER): Payer: BC Managed Care – PPO

## 2022-04-04 DIAGNOSIS — N200 Calculus of kidney: Secondary | ICD-10-CM | POA: Diagnosis not present

## 2022-04-04 DIAGNOSIS — N2889 Other specified disorders of kidney and ureter: Secondary | ICD-10-CM | POA: Diagnosis not present

## 2022-04-04 DIAGNOSIS — N201 Calculus of ureter: Secondary | ICD-10-CM

## 2022-04-04 MED ORDER — MORPHINE SULFATE (PF) 4 MG/ML IV SOLN
4.0000 mg | Freq: Once | INTRAVENOUS | Status: DC
  Filled 2022-04-04: qty 1

## 2022-04-04 MED ORDER — ONDANSETRON HCL 4 MG/2ML IJ SOLN
4.0000 mg | Freq: Once | INTRAMUSCULAR | Status: DC
  Filled 2022-04-04: qty 2

## 2022-04-04 MED ORDER — KETOROLAC TROMETHAMINE 30 MG/ML IJ SOLN
30.0000 mg | Freq: Once | INTRAMUSCULAR | Status: AC
  Administered 2022-04-04: 30 mg via INTRAVENOUS
  Filled 2022-04-04: qty 1

## 2022-04-04 NOTE — ED Provider Notes (Signed)
[facility] EMERGENCY DEPT  Provider Note  CSN: [id number] Arrival date & time: 04/03/22 1954  History Chief Complaint  Patient presents with   Flank Pain    Adrienne [name] is a 51 y.o. female with a history of L sided renal stone diagnosed by outpatient CT in Feb 2023 (1mm proximal ureteral by report, images not available) is unsure if she ever passed the stone. She had a follow up Urology appointment in April with no hematuria and no further flank pains until today when she began to have severe L flank pain radiating to L groin area, similar to previous renal colic. She has had some vomiting but no fevers. No dysuria.    Home Medications Prior to Admission medications   Medication Sig Start Date End Date Taking? Authorizing Provider  ALPRAZolam Prudy Feeler) 1 MG tablet Take 0.5-1 mg by mouth 3 (three) times daily as needed for anxiety.  02/22/18   [provider]  Ascorbic Acid (VITAMIN C) 1000 MG tablet Take 1 tablet by mouth daily.    [provider]  aspirin 81 MG tablet Take 81 mg by mouth daily.    [provider]  Cholecalciferol (VITAMIN D3) 250 MCG (10000 UT) capsule Take 10,000 Units by mouth daily.    [provider]  escitalopram (LEXAPRO) 5 MG tablet Take 5 mg by mouth every other day.    [provider]  Evolocumab (REPATHA SURECLICK) 140 MG/ML SOAJ INJECT 1 PEN INTO THE [place] EVERY 14 DAYS 03/31/22   Lyn Records, MD  fluticasone [facility]) 50 MCG/ACT nasal spray Place 1 spray into both nostrils as needed for allergies.     [provider]  HYDROcodone-acetaminophen (NORCO) 10-325 MG tablet Take 1 tablet by mouth 3 (three) times daily as needed. 03/04/20   [provider]  ibuprofen (ADVIL,MOTRIN) 200 MG tablet Take 600-800 mg by mouth every 6 (six) hours as needed (for pain, headaches, or cramps).    [provider]  icosapent Ethyl (VASCEPA) 1 g capsule Take 2 capsules (2 g total) by mouth 2  (two) times daily. 12/23/21   Lyn Records, MD  metoprolol tartrate (LOPRESSOR) 25 MG tablet Take 1 tablet by mouth twice daily 07/14/21   Lyn Records, MD  Multiple Vitamin (MULTIVITAMIN) tablet Take 1 tablet by mouth daily.    [provider]  nitroGLYCERIN (NITROSTAT) 0.4 MG SL tablet DISSOLVE ONE TABLET UNDER THE TONGUE EVERY 5 MINUTES AS NEEDED FOR CHEST PAIN.  DO NOT EXCEED A TOTAL OF 3 DOSES IN 15 MINUTES 03/23/22   Lyn Records, MD  omeprazole (PRILOSEC) 40 MG capsule Take 40 mg by mouth daily. 07/13/18   [provider]  oxyCODONE (OXY IR/ROXICODONE) 5 MG immediate release tablet Take 5 mg by mouth as needed. 11/21/21   [provider]  ranitidine (ZANTAC) 150 MG capsule Take 150 mg by mouth as needed for heartburn.    [provider]  simvastatin (ZOCOR) 40 MG tablet TAKE 1 TABLET BY MOUTH AT BEDTIME  REPLACES ROSUVASTATIN  01/06/22   Lyn Records, MD     Allergies    Latex, Ciprofloxacin, and Amoxicillin   Review of Systems   Review of Systems Please see HPI for pertinent positives and negatives  Physical Exam BP (!) 156/88   Pulse 69   Temp 98.6 F (37 C) (Oral)   Resp 16   Ht 5\' 4"  (1.626 m)   Wt 121 kg   SpO2 100%  BMI 45.79 kg/m   Physical Exam Vitals and nursing note reviewed.  Constitutional:      Appearance: Normal appearance.  HENT:     Head: Normocephalic and atraumatic.     Nose: Nose normal.     Mouth/Throat:     Mouth: Mucous membranes are moist.  Eyes:     Extraocular Movements: Extraocular movements intact.     Conjunctiva/sclera: Conjunctivae normal.  Cardiovascular:     Rate and Rhythm: Normal rate.  Pulmonary:     Effort: Pulmonary effort is normal.     Breath sounds: Normal breath sounds.  Abdominal:     General: Abdomen is flat.     Palpations: Abdomen is soft.     Tenderness: There is no abdominal tenderness.  Musculoskeletal:        General: No swelling. Normal range of motion.     Cervical  back: Neck supple.  Skin:    General: Skin is warm and dry.  Neurological:     General: No focal deficit present.     Mental Status: She is alert.  Psychiatric:        Mood and Affect: Mood normal.     ED Results / Procedures / Treatments   EKG None  Procedures Procedures  Medications Ordered in the ED Medications  morphine (PF) 4 MG/ML injection 4 mg (4 mg Intravenous Not Given 04/04/22 0205)  ondansetron (ZOFRAN) injection 4 mg (4 mg Intravenous Not Given 04/04/22 0205)  ketorolac (TORADOL) 30 MG/ML injection 30 mg (30 mg Intravenous Given 04/04/22 0204)    Initial Impression and Plan  Patient here with symptoms consistent with renal colic. Labs done in triage show BMP unremarkable. CBC with mild leukocytosis, UA with blood but no signs of infection. I personally viewed the images from radiology studies and agree with radiologist interpretation: CT shows a 20mm distal L ureteral stone. Will give pain/nausea meds and reassess.    ED Course   Clinical Course as of 04/04/22 0308  Sat Apr 04, 2022  0304 Patient's pain improved with Toradol, she declines Morphine tonight and states she is ready to go home. She has motrin and norco at home. Has not tolerated Flomax in the past. Recommend she follow up with Urology. [facility], preferably [facility] if pain is not controlled at home.  [CS]    Clinical Course User Index [CS] Pollyann Savoy, MD     MDM Rules/Calculators/A&P Medical Decision Making Problems Addressed: Ureteral stone: acute illness or injury  Amount and/or Complexity of Data Reviewed External Data Reviewed: radiology. Labs: ordered. Decision-making details documented in ED Course. Radiology: ordered and independent interpretation performed. Decision-making details documented in ED Course.  Risk Prescription drug management. Parenteral controlled substances.    Final Clinical Impression(s) / ED Diagnoses Final diagnoses:  Ureteral stone    Rx / DC Orders ED  Discharge Orders     None        Pollyann Savoy, MD 04/04/22 [phone]

## 2022-04-04 NOTE — ED Notes (Signed)
Pt declined morphine and zofran - was agreeable to toradol - states pain improved to 4/10 -- had been very anxious and took her home Xanax 1mg ; Dr made aware via secure chat

## 2022-04-09 DIAGNOSIS — N2889 Other specified disorders of kidney and ureter: Secondary | ICD-10-CM | POA: Diagnosis not present

## 2022-04-09 DIAGNOSIS — K5900 Constipation, unspecified: Secondary | ICD-10-CM | POA: Diagnosis not present

## 2022-04-09 DIAGNOSIS — N200 Calculus of kidney: Secondary | ICD-10-CM | POA: Diagnosis not present

## 2022-04-09 DIAGNOSIS — E279 Disorder of adrenal gland, unspecified: Secondary | ICD-10-CM | POA: Diagnosis not present

## 2022-04-20 DIAGNOSIS — R300 Dysuria: Secondary | ICD-10-CM | POA: Diagnosis not present

## 2022-04-20 DIAGNOSIS — K5901 Slow transit constipation: Secondary | ICD-10-CM | POA: Diagnosis not present

## 2022-04-21 DIAGNOSIS — M542 Cervicalgia: Secondary | ICD-10-CM | POA: Diagnosis not present

## 2022-04-21 DIAGNOSIS — M5416 Radiculopathy, lumbar region: Secondary | ICD-10-CM | POA: Diagnosis not present

## 2022-04-22 ENCOUNTER — Encounter (HOSPITAL_COMMUNITY): Payer: Self-pay

## 2022-04-22 ENCOUNTER — Emergency Department (HOSPITAL_COMMUNITY)
Admission: EM | Admit: 2022-04-22 | Discharge: 2022-04-22 | Disposition: A | Discharge status: Home / Self Care | Payer: BC Managed Care – PPO | Attending: Emergency Medicine | Admitting: Emergency Medicine

## 2022-04-22 ENCOUNTER — Emergency Department (HOSPITAL_COMMUNITY): Payer: BC Managed Care – PPO

## 2022-04-22 DIAGNOSIS — N132 Hydronephrosis with renal and ureteral calculous obstruction: Secondary | ICD-10-CM | POA: Diagnosis not present

## 2022-04-22 DIAGNOSIS — N201 Calculus of ureter: Secondary | ICD-10-CM

## 2022-04-22 DIAGNOSIS — D72829 Elevated white blood cell count, unspecified: Secondary | ICD-10-CM | POA: Diagnosis not present

## 2022-04-22 DIAGNOSIS — R109 Unspecified abdominal pain: Secondary | ICD-10-CM | POA: Diagnosis not present

## 2022-04-22 DIAGNOSIS — N1330 Unspecified hydronephrosis: Secondary | ICD-10-CM | POA: Diagnosis not present

## 2022-04-22 DIAGNOSIS — Z91040 Latex allergy status: Secondary | ICD-10-CM | POA: Insufficient documentation

## 2022-04-22 DIAGNOSIS — I700 Atherosclerosis of aorta: Secondary | ICD-10-CM | POA: Diagnosis not present

## 2022-04-22 DIAGNOSIS — Z7982 Long term (current) use of aspirin: Secondary | ICD-10-CM | POA: Diagnosis not present

## 2022-04-22 DIAGNOSIS — N200 Calculus of kidney: Secondary | ICD-10-CM | POA: Diagnosis not present

## 2022-04-22 LAB — CBC WITH DIFFERENTIAL/PLATELET
Abs Immature Granulocytes: 0.02 10*3/uL (ref 0.00–0.07)
Basophils Absolute: 0.1 10*3/uL (ref 0.0–0.1)
Basophils Relative: 1 %
Eosinophils Absolute: 0.2 10*3/uL (ref 0.0–0.5)
Eosinophils Relative: 1 %
HCT: 43.9 % (ref 36.0–46.0)
Hemoglobin: 14.4 g/dL (ref 12.0–15.0)
Immature Granulocytes: 0 %
Lymphocytes Relative: 12 %
Lymphs Abs: 1.5 10*3/uL (ref 0.7–4.0)
MCH: 30.1 pg (ref 26.0–34.0)
MCHC: 32.8 g/dL (ref 30.0–36.0)
MCV: 91.8 fL (ref 80.0–100.0)
Monocytes Absolute: 0.9 10*3/uL (ref 0.1–1.0)
Monocytes Relative: 7 %
Neutro Abs: 9.6 10*3/uL — ABNORMAL HIGH (ref 1.7–7.7)
Neutrophils Relative %: 79 %
Platelets: 253 10*3/uL (ref 150–400)
RBC: 4.78 MIL/uL (ref 3.87–5.11)
RDW: 13 % (ref 11.5–15.5)
WBC: 12.2 10*3/uL — ABNORMAL HIGH (ref 4.0–10.5)
nRBC: 0 % (ref 0.0–0.2)

## 2022-04-22 LAB — COMPREHENSIVE METABOLIC PANEL
ALT: 22 U/L (ref 0–44)
AST: 25 U/L (ref 15–41)
Albumin: 4.1 g/dL (ref 3.5–5.0)
Alkaline Phosphatase: 87 U/L (ref 38–126)
Anion gap: 11 (ref 5–15)
BUN: 11 mg/dL (ref 6–20)
CO2: 22 mmol/L (ref 22–32)
Calcium: 9.5 mg/dL (ref 8.9–10.3)
Chloride: 110 mmol/L (ref 98–111)
Creatinine, Ser: 0.89 mg/dL (ref 0.44–1.00)
GFR, Estimated: >60 mL/min (ref >60)
Glucose, Bld: 118 mg/dL — ABNORMAL HIGH (ref 70–99)
Potassium: 3.7 mmol/L (ref 3.5–5.1)
Sodium: 143 mmol/L (ref 135–145)
Total Bilirubin: 0.7 mg/dL (ref 0.3–1.2)
Total Protein: 7.2 g/dL (ref 6.5–8.1)

## 2022-04-22 LAB — URINALYSIS, ROUTINE W REFLEX MICROSCOPIC
Bacteria, UA: NONE SEEN
Bilirubin Urine: NEGATIVE
Glucose, UA: NEGATIVE mg/dL
Ketones, ur: NEGATIVE mg/dL
Leukocytes,Ua: NEGATIVE
Nitrite: NEGATIVE
Protein, ur: NEGATIVE mg/dL
Specific Gravity, Urine: 1.001 — ABNORMAL LOW (ref 1.005–1.030)
pH: 7 (ref 5.0–8.0)

## 2022-04-22 LAB — LIPASE, BLOOD: Lipase: 26 U/L (ref 11–51)

## 2022-04-22 LAB — I-STAT BETA HCG BLOOD, ED (MC, WL, AP ONLY): I-stat hCG, quantitative: <5 m[IU]/mL (ref <5)

## 2022-04-22 MED ORDER — HYDROCODONE-ACETAMINOPHEN 5-325 MG PO TABS: 0.5000 | ORAL_TABLET | ORAL | 0 refills | Status: AC | PRN

## 2022-04-22 MED ORDER — KETOROLAC TROMETHAMINE 30 MG/ML IJ SOLN
30.0000 mg | Freq: Once | INTRAMUSCULAR | Status: AC
  Administered 2022-04-22: 30 mg via INTRAMUSCULAR
  Filled 2022-04-22: qty 1

## 2022-04-22 NOTE — Discharge Instructions (Addendum)
If you develop fever, vomiting, uncontrolled pain, or any other new/concerning symptoms then return to the ER for evaluation.

## 2022-04-22 NOTE — ED Provider Notes (Signed)
[place] DEPT Provider Note   CSN: [id number] Arrival date & time: 04/22/22  1215     History  Chief Complaint  Patient presents with   Flank Pain    Adrienne [name] is a 51 y.o. female.  HPI 51 year old female presents with concern for kidney stone.  She had a kidney stone back in February and thinks she has never really passed it.  Developed recurrent symptoms on August 12 and went to [place].  There she had a CT that showed a distal ureteral stone.  Ever since he has had constant feeling like she needs to urinate and some dysuria.  However she has not had the left flank pain up until this morning.  She developed recurrent pain this morning and has been in pain since.  No vomiting or fevers.  She states that she had a CT scan about 2 weeks ago to follow-up the adrenal mass seen.  Home Medications Prior to Admission medications   Medication Sig Start Date End Date Taking? Authorizing Provider  HYDROcodone-acetaminophen (NORCO) 5-325 MG tablet Take 0.5-1 tablets by mouth every 4 (four) hours as needed. 04/22/22  Yes Sherwood Gambler, MD  ALPRAZolam Duanne Moron) 1 MG tablet Take 0.5-1 mg by mouth 3 (three) times daily as needed for anxiety.  02/22/18   [provider]  Ascorbic Acid (VITAMIN C) 1000 MG tablet Take 1 tablet by mouth daily.    [provider]  aspirin 81 MG tablet Take 81 mg by mouth daily.    [provider]  Cholecalciferol (VITAMIN D3) 250 MCG (10000 UT) capsule Take 10,000 Units by mouth daily.    [provider]  escitalopram (LEXAPRO) 5 MG tablet Take 5 mg by mouth every other day.    [provider]  Evolocumab (REPATHA SURECLICK) XX123456 MG/ML SOAJ INJECT 1 PEN INTO THE [place] EVERY 14 DAYS 03/31/22   Belva Crome, MD  fluticasone [facility]) 50 MCG/ACT nasal spray Place 1 spray into both nostrils as needed for allergies.     [provider]  ibuprofen (ADVIL,MOTRIN) 200 MG tablet Take  600-800 mg by mouth every 6 (six) hours as needed (for pain, headaches, or cramps).    [provider]  icosapent Ethyl (VASCEPA) 1 g capsule Take 2 capsules (2 g total) by mouth 2 (two) times daily. 12/23/21   Belva Crome, MD  metoprolol tartrate (LOPRESSOR) 25 MG tablet Take 1 tablet by mouth twice daily 07/14/21   Belva Crome, MD  Multiple Vitamin (MULTIVITAMIN) tablet Take 1 tablet by mouth daily.    [provider]  nitroGLYCERIN (NITROSTAT) 0.4 MG SL tablet DISSOLVE ONE TABLET UNDER THE TONGUE EVERY 5 MINUTES AS NEEDED FOR CHEST PAIN.  DO NOT EXCEED A TOTAL OF 3 DOSES IN 15 MINUTES 03/23/22   Belva Crome, MD  omeprazole (PRILOSEC) 40 MG capsule Take 40 mg by mouth daily. 07/13/18   [provider]  ranitidine (ZANTAC) 150 MG capsule Take 150 mg by mouth as needed for heartburn.    [provider]  simvastatin (ZOCOR) 40 MG tablet TAKE 1 TABLET BY MOUTH AT BEDTIME  01/06/22   Belva Crome, MD      Allergies    Latex, Ciprofloxacin, and Amoxicillin    Review of Systems   Review of Systems  Constitutional:  Negative for fever.  Gastrointestinal:  Negative for abdominal pain and vomiting.  Genitourinary:  Positive for dysuria and flank pain.    Physical  Exam Updated Vital Signs BP (!) 150/90   Pulse 68   Temp (!) 97.2 F (36.2 C) (Oral)   Resp 18   SpO2 98%  Physical Exam Vitals and nursing note reviewed.  Constitutional:      General: She is not in acute distress.    Appearance: She is well-developed. She is obese. She is not ill-appearing or diaphoretic.  HENT:     Head: Normocephalic and atraumatic.  Cardiovascular:     Rate and Rhythm: Normal rate and regular rhythm.     Heart sounds: Normal heart sounds.  Pulmonary:     Effort: Pulmonary effort is normal.     Breath sounds: Normal breath sounds.  Abdominal:     Palpations: Abdomen is soft.     Tenderness: There is no abdominal tenderness. There is right CVA tenderness.   Skin:    General: Skin is warm and dry.  Neurological:     Mental Status: She is alert.     ED Results / Procedures / Treatments   Labs (all labs ordered are listed, but only abnormal results are displayed) Labs Reviewed  COMPREHENSIVE METABOLIC PANEL - Abnormal; Notable for the following components:      Result Value   Glucose, Bld 118 (*)    All other components within normal limits  CBC WITH DIFFERENTIAL/PLATELET - Abnormal; Notable for the following components:   WBC 12.2 (*)    Neutro Abs 9.6 (*)    All other components within normal limits  URINALYSIS, ROUTINE W REFLEX MICROSCOPIC - Abnormal; Notable for the following components:   Color, Urine COLORLESS (*)    Specific Gravity, Urine 1.001 (*)    Hgb urine dipstick MODERATE (*)    All other components within normal limits  LIPASE, BLOOD  I-STAT BETA HCG BLOOD, ED ([facility], [facility], [facility] ONLY)    EKG None  Radiology CT Renal Stone Study  Result Date: 04/22/2022 CLINICAL DATA:  Left flank pain beginning this morning. History of kidney stones. EXAM: CT ABDOMEN AND PELVIS WITHOUT CONTRAST TECHNIQUE: Multidetector CT imaging of the abdomen and pelvis was performed following the standard protocol without IV contrast. RADIATION DOSE REDUCTION: This exam was performed according to the departmental dose-optimization program which includes automated exposure control, adjustment of the mA and/or kV according to patient size and/or use of iterative reconstruction technique. COMPARISON:  04/04/2022. FINDINGS: Lower chest: Lung bases essentially clear. Hepatobiliary: Liver normal in size. Decreased liver parenchymal attenuation consistent with mild fatty infiltration. No mass or focal lesion. Normal gallbladder. No bile duct dilation. Pancreas: Unremarkable. No pancreatic ductal dilatation or surrounding inflammatory changes. Spleen: Normal in size without focal abnormality. Adrenals/Urinary Tract: Mixed attenuation left adrenal mass measuring 5.7 x  4.4 x 4.6 cm, unchanged. There are foci of internal macroscopic fat. There several small calcifications. Normal right adrenal gland. Mild to moderate left hydronephrosis with left perinephric stranding and mild left hydroureter. This is due to a 5 mm stone in the distal ureter just above the ureterovesicular junction. Appearance is similar to the recent prior CT, stone moving slightly more distally. No right hydronephrosis. No renal masses. Small nonobstructing stone in the lower pole the right kidney. No other intrarenal stones. Normal right ureter. Bladder is unremarkable. Stomach/Bowel: Stomach is within normal limits. Appendix appears normal. No evidence of bowel wall thickening, distention, or inflammatory changes. Vascular/Lymphatic: Aortic atherosclerosis. No aneurysm. No enlarged lymph nodes. Reproductive: Uterus normal size. Contour bulge along the left anterior upper uterine segment consistent with a subserosal fibroid,  stable. No adnexal masses. Other: No abdominal wall hernia or abnormality. No abdominopelvic ascites. Musculoskeletal: No fracture or acute finding.  No bone lesion. IMPRESSION: 1. 5 mm distal left ureteral stone, just above the ureterovesicular junction. This appears to be the same stone that was in the distal ureter on the prior CT. It causes mild to moderate left hydronephrosis, left perinephric stranding and mild left hydroureter, also similar to the prior CT. 2. 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 3. Aortic atherosclerosis. Electronically Signed   By: Amie Portland M.D.   On: 04/22/2022 13:48    Procedures Procedures    Medications Ordered in ED Medications  ketorolac (TORADOL) 30 MG/ML injection 30 mg (has no administration in time range)    ED Course/ Medical Decision Making/ A&P                           Medical Decision Making Risk Prescription drug management.   CT images viewed today and there is a  distal ureteral stone with hydronephrosis.  Otherwise labs are pretty reassuring including mild WBC elevation but normal electrolytes besides mild hyperglycemia.  Urinalysis shows blood but no UTI.  I suspect her dysuria and urinary complaints are from the ureteral stone itself.  Doubt infected stone.  She would like some Toradol which helped a lot last time but does not want an IV so will be given IM Toradol and will be discharged.  Short course of hydrocodone for pain as the hydrocodone she has at home is expired.  Otherwise she was given return precautions and appears stable for discharge.        Final Clinical Impression(s) / ED Diagnoses Final diagnoses:  Ureteral stone    Rx / DC Orders ED Discharge Orders          Ordered    HYDROcodone-acetaminophen (NORCO) 5-325 MG tablet  Every 4 hours PRN        04/22/22 2046              Pricilla Loveless, MD 04/22/22 2106

## 2022-04-22 NOTE — ED Provider Triage Note (Signed)
Emergency Medicine Provider Triage Evaluation Note  Adrienne [name] , a 51 y.o. female  was evaluated in triage.  Pt complains of flank pain.  She says over the past 2 to 3 weeks she has had constant dysuria.  She says a couple weeks ago she was seen in the emergency department for the similar plank plank and was diagnosed with a kidney stone.  She had a CT scan at that time that showed pyelonephritis as well as an obstructive kidney stone.  She is seen urology since then and has had no intervention because the flank pain had went away.  She says that the pain started again today. She has not been on antibiotics.   Review of Systems  Positive:  Negative:   Physical Exam  BP (!) 154/88 (BP Location: Right Arm)   Pulse 74   Temp 99 F (37.2 C) (Oral)   Resp 20   SpO2 94%  Gen:   Awake, no distress   Resp:  Normal effort  MSK:   Moves extremities without difficulty  Other:  + L CVA tenderness  Medical Decision Making  Medically screening exam initiated at 12:56 PM.  Appropriate orders placed.  Adrienne [name] was informed that the remainder of the evaluation will be completed by another provider, this initial triage assessment does not replace that evaluation, and the importance of remaining in the ED until their evaluation is complete.     Claudie Leach, PA-C 04/22/22 1256

## 2022-04-22 NOTE — ED Triage Notes (Signed)
Pt arrived via POV, c/o left sided flank pain and continuous burning and urge to urinate since.

## 2022-04-22 NOTE — ED Notes (Signed)
Meal given to patient. JRPRN

## 2022-04-28 DIAGNOSIS — N201 Calculus of ureter: Secondary | ICD-10-CM | POA: Diagnosis not present

## 2022-04-28 DIAGNOSIS — N200 Calculus of kidney: Secondary | ICD-10-CM | POA: Diagnosis not present

## 2022-05-05 DIAGNOSIS — E278 Other specified disorders of adrenal gland: Secondary | ICD-10-CM | POA: Diagnosis not present

## 2022-05-05 DIAGNOSIS — N200 Calculus of kidney: Secondary | ICD-10-CM | POA: Diagnosis not present

## 2022-05-11 DIAGNOSIS — N201 Calculus of ureter: Secondary | ICD-10-CM | POA: Diagnosis not present

## 2022-05-28 DIAGNOSIS — J40 Bronchitis, not specified as acute or chronic: Secondary | ICD-10-CM | POA: Diagnosis not present

## 2022-05-28 DIAGNOSIS — J329 Chronic sinusitis, unspecified: Secondary | ICD-10-CM | POA: Diagnosis not present

## 2022-06-10 DIAGNOSIS — N201 Calculus of ureter: Secondary | ICD-10-CM | POA: Diagnosis not present

## 2022-06-16 DIAGNOSIS — U071 COVID-19: Secondary | ICD-10-CM | POA: Diagnosis not present

## 2022-06-29 ENCOUNTER — Telehealth: Payer: Self-pay | Admitting: Pharmacist

## 2022-06-29 NOTE — Telephone Encounter (Signed)
PA renewal request received Key: [id number] Sent to plan.

## 2022-06-30 ENCOUNTER — Other Ambulatory Visit: Payer: Self-pay | Admitting: Interventional Cardiology

## 2022-07-01 DIAGNOSIS — E278 Other specified disorders of adrenal gland: Secondary | ICD-10-CM | POA: Diagnosis not present

## 2022-07-10 ENCOUNTER — Telehealth: Payer: Self-pay | Admitting: Pharmacist

## 2022-07-10 NOTE — Telephone Encounter (Signed)
PA for Repatha submitted. Key: [id number]

## 2022-07-12 MED ORDER — REPATHA SURECLICK 140 MG/ML ~~LOC~~ SOAJ: SUBCUTANEOUS | 11 refills | Status: DC

## 2022-07-12 NOTE — Telephone Encounter (Signed)
PA approved to 06/28/2023

## 2022-07-12 NOTE — Addendum Note (Signed)
Addended by: Rosalee Kaufman on: 07/12/2022 01:48 PM   Modules accepted: Orders

## 2022-08-05 DIAGNOSIS — E278 Other specified disorders of adrenal gland: Secondary | ICD-10-CM | POA: Diagnosis not present

## 2022-09-04 ENCOUNTER — Telehealth: Payer: Self-pay | Admitting: Cardiology

## 2022-09-04 MED ORDER — REPATHA SURECLICK 140 MG/ML ~~LOC~~ SOAJ: SUBCUTANEOUS | 11 refills | Status: DC

## 2022-09-04 NOTE — Telephone Encounter (Signed)
*  STAT* If patient is at the pharmacy, call can be transferred to refill team.   1. Which medications need to be refilled? (please list name of each medication and dose if known)  Evolocumab (REPATHA SURECLICK) 299 MG/ML SOAJ   2. Which pharmacy/location (including street and city if local pharmacy) is medication to be sent to? [place], [place]   3. Do they need a 30 day or 90 day supply? 30 day  Patient is due for her shot. She has an appointment with Dr. Johney Frame 12/09/2022.

## 2022-09-04 NOTE — Telephone Encounter (Signed)
Refill sent to pharmacy.   

## 2022-09-10 ENCOUNTER — Telehealth: Payer: Self-pay

## 2022-09-10 NOTE — Telephone Encounter (Signed)
**Note De-Identified Lucilla Petrenko Obfuscation** Vascepa PA started through covermymeds. Key: [id number]

## 2022-09-16 ENCOUNTER — Telehealth: Payer: Self-pay | Admitting: Pharmacist

## 2022-09-16 NOTE — Telephone Encounter (Addendum)
Received fax from pt's pharmacy that another Repatha PA is needed. Just had one submitted last November and approved for this year  through 06/28/23. Not sure if pt's insurance changed, will try resubmitting again. Key: [id number]

## 2022-09-16 NOTE — Telephone Encounter (Signed)
Received message from pt's insurance that she already has a prior authorization approved on file. Not sure why pharmacy send [place] message saying new PA is needed. Will fax them this info.

## 2022-09-25 ENCOUNTER — Other Ambulatory Visit (HOSPITAL_COMMUNITY): Payer: Self-pay

## 2022-11-05 ENCOUNTER — Other Ambulatory Visit (HOSPITAL_COMMUNITY): Payer: Self-pay

## 2022-11-06 ENCOUNTER — Telehealth: Payer: Self-pay

## 2022-11-06 NOTE — Telephone Encounter (Signed)
Pharmacy Patient Advocate Encounter   Received notification from cmm&fax that prior authorization for Repatha 140mg /ml is required/requested.  Per Test ptss plan: prior authorization is ''NOT NEEDED'' .Marland KitchenPlease see the document in media

## 2022-11-09 ENCOUNTER — Telehealth: Payer: Self-pay | Admitting: Cardiology

## 2022-11-09 MED ORDER — REPATHA SURECLICK 140 MG/ML ~~LOC~~ SOAJ: SUBCUTANEOUS | 11 refills | Status: DC

## 2022-11-09 NOTE — Telephone Encounter (Signed)
*  STAT* If patient is at the pharmacy, call can be transferred to refill team.   1. Which medications need to be refilled? (please list name of each medication and dose if known) Evolocumab (REPATHA SURECLICK) [id number] MG/ML SOAJ   2. Which pharmacy/location (including street and city if local pharmacy) is medication to be sent to?  [place], [place]    3. Do they need a 30 day or 90 day supply? 30 days   Pt states she is out of the medication. She says that the [facility] told her it was faxed on 3/13 and 3/15 but nothing was ever received from our office.

## 2022-11-30 NOTE — Progress Notes (Addendum)
Cardiology Office Note:   Date:  12/10/2022  ID:  Adrienne [name], DOB [birth year], MRN [id number]  History of Present Illness:   Adrienne [name] is a 52 y.o. female with history of OSA on CPAP, CAD with ostial RCA PCI in 02/2018, morbid obesity, and HLD who was previously followed by Dr. Katrinka Blazing who now returns to clinic for follow-up.  Was last seen by Dr. Katrinka Blazing in 11/2021 where she was doing well from a CV standpoint with rare, nonexertional chest pain.  Today, the patient overall feels well. She is planned for surgery for adrenal mass removal. She is hoping for pre-operative evaluation. Has chronic pain that limits her mobility, but no exertional chest pain or significant dyspnea. No orthopnea, PND or LE edema. States her mother had difficulty with a myoview once and she was hoping to avoid this testing. She is amenable to PET scan (even though the same stress agent is used) as this is a shorter test.  Has been recently diagnosed with Cushing syndrome which she believes is contributing to her weight gain as well.   Past Medical History:  Diagnosis Date   CAD (coronary artery disease) 03/18/2018   S/p NSTEMI 7/19: LHC - oLAD 50, mLCx 50, oRCA 90 >> PCI:  DES to [facility] // Echo 7/19:  Mild concentric LVH, EF 65-70, normal wall motion, normal diastolic function, trivial TR   Coronary artery disease    GERD (gastroesophageal reflux disease)    Hyperlipidemia    Morbid obesity 07/29/2018   OSA (obstructive sleep apnea) 07/29/2018   Severe OSA with an AHI of 48/hr and no central sleep apnea.  Her O2 sats dropped to 77% with respiratory events. She is now on auto CPAP     ROS: As per HPI  Studies Reviewed:    EKG:  NSR, HR 68-personally reviewed  Cardiac Studies & Procedures   CARDIAC CATHETERIZATION  CARDIAC CATHETERIZATION 02/25/2018  Narrative  Ost LAD to Prox LAD lesion is 50% stenosed.  Mid Cx lesion is 50% stenosed.  Ost RCA lesion is 90% stenosed.  A drug-eluting stent was  successfully placed using a STENT [place]ERRA 4.00 X 12 MM.  Post intervention, there is a 0% residual stenosis.  Successful stenting of the ostial RCA using a 4.0x12 mm Xience Sierra DES  Recommend uninterrupted dual antiplatelet therapy with Aspirin 81mg  daily and Ticagrelor 90mg  twice daily for a minimum of 12 months (ACS - Class I recommendation).  Findings Coronary Findings Diagnostic  Dominance: Right  Left Anterior Descending Ost LAD to Prox LAD lesion is 50% stenosed.  Left Circumflex Vessel is moderate in size. Mid Cx lesion is 50% stenosed.  Right Coronary Artery Vessel is large. Ost RCA lesion is 90% stenosed.  Intervention  Ost RCA lesion Stent Lesion crossed with guidewire using a WIRE COUGAR XT STRL 190CM. A drug-eluting stent was successfully placed using a STENT SIERRA 4.00 X 12 MM. Post-stent angioplasty was performed using a BALLOON [place] EMERGE MR 4.5X8. Maximum pressure:  16 atm. Post-Intervention Lesion Assessment The intervention was successful. Pre-interventional TIMI flow is 3. Post-intervention TIMI flow is 3. No complications occurred at this lesion. There is a 0% residual stenosis post intervention.   CARDIAC CATHETERIZATION 02/23/2018  Narrative  Ost RCA lesion is 90% stenosed.  Mid Cx lesion is 50% stenosed.  Ost LAD to Prox LAD lesion is 50% stenosed.  1. Severe stenosis in the ostium of the large, dominant RCA 2. Moderate non-obstructive disease in the proximal and mid LAD  and in the mid Circumflex artery 3. Normal filling pressures  Recommendations: Unable to access the RCA well from the radial approach. PCI of the RCA will need to be performed from the groin approach. She is morbidly obese and I do not think attempting access in her femoral artery tonight after anti-coagulation is the best approach. She will be loaded with Brilinta tonight. I will place her on the schedule for PCI on Friday with Dr. Excell Seltzer.  Findings Coronary  Findings Diagnostic  Dominance: Right  Left Anterior Descending Ost LAD to Prox LAD lesion is 50% stenosed.  Left Circumflex Vessel is moderate in size. Mid Cx lesion is 50% stenosed.  Right Coronary Artery Vessel is large. Ost RCA lesion is 90% stenosed.  Intervention  No interventions have been documented.     ECHOCARDIOGRAM  ECHOCARDIOGRAM COMPLETE 02/23/2018  Narrative *[place]* *[facility]* [address] [place], [place] [address] [phone]  ------------------------------------------------------------------- Transthoracic Echocardiography  Patient:    Adrienne, [name] MR #:       [id number] Study Date: 02/23/2018 Gender:     F Age:        80 Height:     162.6 cm Weight:     108.6 kg BSA:        2.27 m^2 Pt. Status: Room:       3E28C  ADMITTING    Doristine Devoid REFERRING    Minto, Meryle Ready ATTENDING    Horton, Mayer Masker PERFORMING   [facility], Inpatient SONOGRAPHER  Thurman Coyer  cc:  ------------------------------------------------------------------- LV EF: 65% -   70%  ------------------------------------------------------------------- Indications:      Chest pain 786.51.  ------------------------------------------------------------------- History:   Risk factors:  Dyslipidemia.  ------------------------------------------------------------------- Study Conclusions  - Left ventricle: The cavity size was normal. There was mild concentric hypertrophy. Systolic function was vigorous. The estimated ejection fraction was in the range of 65% to 70%. Wall motion was normal; there were no regional wall motion abnormalities. Left ventricular diastolic function parameters were normal. - Aortic valve: There was no regurgitation. - Aortic root: The aortic root was normal in size. - Right ventricle: The cavity size was normal. Wall thickness was normal. Systolic function was  normal. - Right atrium: The atrium was normal in size. - Tricuspid valve: There was trivial regurgitation. - Pulmonic valve: There was no regurgitation. - Pulmonary arteries: Systolic pressure was within the normal range. - Inferior vena cava: The vessel was normal in size. - Pericardium, extracardiac: There was no pericardial effusion.  Impressions:  - Normal study. There is no significant change since the prior study on 09/16/2012.  ------------------------------------------------------------------- Study data:  Comparison was made to the study of 09/16/2012.  Study status:  Routine.  Procedure:  The patient reported no pain pre or post test. Transthoracic echocardiography. Image quality was adequate.  Study completion:  There were no complications. Transthoracic echocardiography.  M-mode, complete 2D, spectral Doppler, and color Doppler.  Birthdate:  Patient birthdate: 05/17/1971.  Age:  Patient is 52 yr old.  Sex:  Gender: female. BMI: 41.1 kg/m^2.  Blood pressure:     156/85  Patient status: Inpatient.  Study date:  Study date: 02/23/2018. Study time: 11:40 AM.  Location:  Echo laboratory.  -------------------------------------------------------------------  ------------------------------------------------------------------- Left ventricle:  The cavity size was normal. There was mild concentric hypertrophy. Systolic function was vigorous. The estimated ejection fraction was in the range of 65% to 70%. Wall motion was normal; there  were no regional wall motion abnormalities. The transmitral flow pattern was normal. The deceleration time of the early transmitral flow velocity was normal. The pulmonary vein flow pattern was normal. The tissue Doppler parameters were normal. Left ventricular diastolic function parameters were normal.  ------------------------------------------------------------------- Aortic valve:   Trileaflet; normal thickness leaflets. Mobility was not  restricted.  Doppler:  Transvalvular velocity was within the normal range. There was no stenosis. There was no regurgitation.  ------------------------------------------------------------------- Aorta:  Aortic root: The aortic root was normal in size.  ------------------------------------------------------------------- Mitral valve:   Structurally normal valve.   Mobility was not restricted.  Doppler:  Transvalvular velocity was within the normal range. There was no evidence for stenosis. There was no regurgitation.    Peak gradient (D): 4 mm Hg.  ------------------------------------------------------------------- Left atrium:  The atrium was normal in size.  ------------------------------------------------------------------- Right ventricle:  The cavity size was normal. Wall thickness was normal. Systolic function was normal.  ------------------------------------------------------------------- Pulmonic valve:    Structurally normal valve.   Cusp separation was normal.  Doppler:  Transvalvular velocity was within the normal range. There was no evidence for stenosis. There was no regurgitation.  ------------------------------------------------------------------- Tricuspid valve:   Structurally normal valve.    Doppler: Transvalvular velocity was within the normal range. There was trivial regurgitation.  ------------------------------------------------------------------- Pulmonary artery:   The main pulmonary artery was normal-sized. Systolic pressure was within the normal range.  ------------------------------------------------------------------- Right atrium:  The atrium was normal in size.  ------------------------------------------------------------------- Pericardium:  There was no pericardial effusion.  ------------------------------------------------------------------- Systemic veins: Inferior vena cava: The vessel was normal in  size.  ------------------------------------------------------------------- Measurements  Left ventricle                         Value        Reference LV ID, ED, PLAX chordal                44.7  mm     43 - 52 LV ID, ES, PLAX chordal                33.6  mm     23 - 38 LV fx shortening, PLAX chordal (L)     25    %      >=29 LV PW thickness, ED                    11    mm     ---------- IVS/LV PW ratio, ED                    1            <=1.3 Stroke volume, 2D                      81    ml     ---------- Stroke volume/bsa, 2D                  36    ml/m^2 ---------- LV e&', lateral                         9.79  cm/s   ---------- LV E/e&', lateral                       10.42        ---------- LV e&', medial  8.05  cm/s   ---------- LV E/e&', medial                        12.67        ---------- LV e&', average                         8.92  cm/s   ---------- LV E/e&', average                       11.43        ----------  Ventricular septum                     Value        Reference IVS thickness, ED                      11    mm     ----------  LVOT                                   Value        Reference LVOT ID, S                             20    mm     ---------- LVOT area                              3.14  cm^2   ---------- LVOT peak velocity, S                  145   cm/s   ---------- LVOT mean velocity, S                  87.9  cm/s   ---------- LVOT VTI, S                            25.7  cm     ---------- LVOT peak gradient, S                  8     mm Hg  ----------  Aorta                                  Value        Reference Aortic root ID, ED                     29    mm     ----------  Left atrium                            Value        Reference LA ID, A-P, ES                         37    mm     ---------- LA ID/bsa, A-P  1.63  cm/m^2 <=2.2 LA volume, S                           76.8  ml     ---------- LA  volume/bsa, S                       33.8  ml/m^2 ---------- LA volume, ES, 1-p A4C                 64.4  ml     ---------- LA volume/bsa, ES, 1-p A4C             28.4  ml/m^2 ---------- LA volume, ES, 1-p A2C                 84.2  ml     ---------- LA volume/bsa, ES, 1-p A2C             37.1  ml/m^2 ----------  Mitral valve                           Value        Reference Mitral E-wave peak velocity            102   cm/s   ---------- Mitral A-wave peak velocity            74.6  cm/s   ---------- Mitral deceleration time               201   ms     150 - 230 Mitral peak gradient, D                4     mm Hg  ---------- Mitral E/A ratio, peak                 1.4          ----------  Pulmonary arteries                     Value        Reference PA pressure, S, DP                     29    mm Hg  <=30  Tricuspid valve                        Value        Reference Tricuspid regurg peak velocity         256   cm/s   ---------- Tricuspid peak RV-RA gradient          26    mm Hg  ----------  Right atrium                           Value        Reference RA ID, S-I, ES, A4C                    48.3  mm     34 - 49 RA area, ES, A4C                       13.9  cm^2   8.3 - 19.5 RA volume, ES, A/L  32    ml     ---------- RA volume/bsa, ES, A/L                 14.1  ml/m^2 ----------  Systemic veins                         Value        Reference Estimated CVP                          3     mm Hg  ----------  Right ventricle                        Value        Reference TAPSE                                  26.4  mm     ---------- RV pressure, S, DP                     29    mm Hg  <=30 RV s&', lateral, S                      12    cm/s   ----------  Legend: (L)  and  (H)  mark values outside specified reference range.  ------------------------------------------------------------------- Prepared and Electronically Authenticated by  Tobias Alexander,  M.D. 2019-07-03T13:47:17              Risk Assessment/Calculations:              Physical Exam:   VS:  BP 124/72   Pulse 68   Ht 5\' 4"  (1.626 m)   Wt 250 lb 9.6 oz (113.7 kg)   SpO2 97%   BMI 43.02 kg/m    Wt Readings from Last 3 Encounters:  12/09/22 250 lb 9.6 oz (113.7 kg)  04/03/22 266 lb 12.1 oz (121 kg)  12/03/21 266 lb 12.8 oz (121 kg)     GEN: Well nourished, well developed in no acute distress NECK: No JVD; No carotid bruits CARDIAC: RRR, no murmurs, rubs, gallops RESPIRATORY:  Clear to auscultation without rales, wheezing or rhonchi  ABDOMEN: Obese, NTTP EXTREMITIES:  No edema; No deformity   ASSESSMENT AND PLAN:    #Pre-Op Evaluation: #CAD with history of NSTEMI s/p PCI to RCA: -Patient with history of NSTEMI s/p PCI to ostial RCA in 02/2018 -No anginal symptoms but mobility is limited due to chronic back pain -Due to inability to complete >4METs and plan for surgery in 01/2023, will check cardiac PET for further evaluation (patient amenable for PET and declined myoview as mother had bad experience with this in the past) -Continue ASA 81mg  daily -Continue repatha 140mg  q 2 weeks -Continue vascepa 2g BID -Continue simvastatin 40mg  daily -Continue metoprolol 25mg  BID -Continue nitro prn  #HLD: -Continue repatha 140mg  q 2 weeks -Continue vascepa 2g BID -Continue simvastatin 40mg  daily -Check lipid panel  #Morbid Obesity: -Lifestyle modifications as able -Check A1C for screening     Shared Decision Making/Informed Consent The risks [chest pain, shortness of breath, cardiac arrhythmias, dizziness, blood pressure fluctuations, myocardial infarction, stroke/transient ischemic attack, nausea, vomiting, allergic reaction, radiation exposure, metallic taste sensation and life-threatening complications (estimated to be 1 in 10,000)], benefits (risk stratification, diagnosing  coronary artery disease, treatment guidance) and alternatives of a cardiac PET stress  test were discussed in detail with Adrienne [name] and she agrees to proceed.   Signed, Meriam Sprague, MD    ADDENDUM:  NM PET reviewed at length and is likely normal. Suspect the reduction in counts in the anterior wall is likely breast artifact given body habitus. MBF normal in this region. No further testing needed prior to OR.  Laurance Flatten, MD

## 2022-12-09 ENCOUNTER — Ambulatory Visit: Payer: BC Managed Care – PPO | Attending: Cardiology | Admitting: Cardiology

## 2022-12-09 ENCOUNTER — Encounter: Payer: Self-pay | Admitting: Cardiology

## 2022-12-09 VITALS — BP 124/72 | HR 68 | Ht 64.0 in | Wt 250.6 lb

## 2022-12-09 DIAGNOSIS — Z79899 Other long term (current) drug therapy: Secondary | ICD-10-CM

## 2022-12-09 DIAGNOSIS — I2510 Atherosclerotic heart disease of native coronary artery without angina pectoris: Secondary | ICD-10-CM | POA: Diagnosis not present

## 2022-12-09 DIAGNOSIS — E782 Mixed hyperlipidemia: Secondary | ICD-10-CM

## 2022-12-09 DIAGNOSIS — Z01818 Encounter for other preprocedural examination: Secondary | ICD-10-CM

## 2022-12-09 DIAGNOSIS — G4733 Obstructive sleep apnea (adult) (pediatric): Secondary | ICD-10-CM

## 2022-12-09 NOTE — Patient Instructions (Signed)
Medication Instructions:   Your physician recommends that you continue on your current medications as directed. Please refer to the Current Medication list given to you today.  *If you need a refill on your cardiac medications before your next appointment, please call your pharmacy*   Lab Work:  SOMETIME SOON HERE IN THE OFFICE--LIPIDS AND HEMOGLOBIN A1C--PLEASE COME FASTING TO THIS LAB APPOINTMENT  If you have labs (blood work) drawn today and your tests are completely normal, you will receive your results only by: MyChart Message (if you have MyChart) OR A paper copy in the mail If you have any lab test that is abnormal or we need to change your treatment, we will call you to review the results.   Testing/Procedures:  How to Prepare for Your Cardiac PET/CT Stress Test:  1. Please do not take these medications before your test:   Medications that may interfere with the cardiac pharmacological stress agent (ex. nitrates - including erectile dysfunction medications, isosorbide mononitrate, tamulosin or beta-blockers) the day of the exam. PLEASE HOLD YOUR METOPROLOL AND NITROGLYCERIN THE DAY OF THIS EXAM Theophylline containing medications for 12 hours.  Your remaining medications may be taken with water.  2. Nothing to eat or drink, except water, 3 hours prior to arrival time.   NO caffeine/decaffeinated products, or chocolate 12 hours prior to arrival.  3. NO perfume, cologne or lotion  4. Total time is 1 to 2 hours; you may want to bring reading material for the waiting time.  5. Please report to Radiology at the [facility] Main Entrance 30 minutes early for your test.  [address] [place], [place] [address]   IF YOU THINK YOU MAY BE PREGNANT, OR ARE NURSING PLEASE INFORM THE TECHNOLOGIST.  In preparation for your appointment, medication and supplies will be purchased.  Appointment availability is limited, so if you need to cancel or reschedule, please call  the Radiology Department at [phone]  24 hours in advance to avoid a cancellation fee of $100.00  What to Expect After you Arrive:  Once you arrive and check in for your appointment, you will be taken to a preparation room within the Radiology Department.  A technologist or Nurse will obtain your medical history, verify that you are correctly prepped for the exam, and explain the procedure.  Afterwards,  an IV will be started in your arm and electrodes will be placed on your skin for EKG monitoring during the stress portion of the exam. Then you will be escorted to the PET/CT scanner.  There, staff will get you positioned on the scanner and obtain a blood pressure and EKG.  During the exam, you will continue to be connected to the EKG and blood pressure machines.  A small, safe amount of a radioactive tracer will be injected in your IV to obtain a series of pictures of your heart along with an injection of a stress agent.    After your Exam:  It is recommended that you eat a meal and drink a caffeinated beverage to counter act any effects of the stress agent.  Drink plenty of fluids for the remainder of the day and urinate frequently for the first couple of hours after the exam.  Your doctor will inform you of your test results within 7-10 business days.  For questions about your test or how to prepare for your test, please call: Rockwell Alexandria, Cardiac Imaging Nurse Navigator  Larey Brick, Cardiac Imaging Nurse Navigator Office: [phone]  Follow-Up: At [facility], you and your health needs are our priority.  As part of our continuing mission to provide you with exceptional heart care, we have created designated Provider Care Teams.  These Care Teams include your primary Cardiologist (physician) and Advanced Practice Providers (APPs -  Physician Assistants and Nurse Practitioners) who all work together to provide you with the care you need, when you need it.  We recommend  signing up for the patient portal called "MyChart".  Sign up information is provided on this After Visit Summary.  MyChart is used to connect with patients for Virtual Visits (Telemedicine).  Patients are able to view lab/test results, encounter notes, upcoming appointments, etc.  Non-urgent messages can be sent to your provider as well.   To learn more about what you can do with MyChart, go to [email or web address].    Your next appointment:   1 year(s)  Provider:   DR. Shari Prows

## 2022-12-11 ENCOUNTER — Telehealth: Payer: Self-pay | Admitting: *Deleted

## 2022-12-11 NOTE — Telephone Encounter (Signed)
-----   Message from Dorette Grate, RN sent at 12/10/2022 12:47 PM EDT ----- Patient scheduled for May 8.  Thanks Merle ----- Message ----- From: Meriam Sprague, MD Sent: 12/09/2022   5:01 PM EDT To: Loa Socks, LPN; Lennie Odor, RN; #  Hey Team,  Any chance we can get her scheduled for a PET before June? She is having surgery and her mother had a bad experience with a myoview one time and I cannot convince her to get one. She is amenable to the PET as it is a shorter testing period (even though the med is the same and she knows this). She has known CAD with prior PCI so we cannot do CTA and she cannot walk for a treadmill.  If not, I can see what else she is willing to do.  Kateri Plummer

## 2022-12-14 ENCOUNTER — Ambulatory Visit: Payer: BC Managed Care – PPO | Attending: Cardiology

## 2022-12-14 DIAGNOSIS — I2510 Atherosclerotic heart disease of native coronary artery without angina pectoris: Secondary | ICD-10-CM

## 2022-12-14 DIAGNOSIS — Z01818 Encounter for other preprocedural examination: Secondary | ICD-10-CM

## 2022-12-14 DIAGNOSIS — Z79899 Other long term (current) drug therapy: Secondary | ICD-10-CM

## 2022-12-14 DIAGNOSIS — E782 Mixed hyperlipidemia: Secondary | ICD-10-CM

## 2022-12-15 LAB — LIPID PANEL
Chol/HDL Ratio: 2.1 ratio (ref 0.0–4.4)
Cholesterol, Total: 150 mg/dL (ref 100–199)
HDL: 71 mg/dL (ref >39)
LDL Chol Calc (NIH): 63 mg/dL (ref 0–99)
Triglycerides: 82 mg/dL (ref 0–149)
VLDL Cholesterol Cal: 16 mg/dL (ref 5–40)

## 2022-12-15 LAB — HEMOGLOBIN A1C
Est. average glucose Bld gHb Est-mCnc: 117 mg/dL
Hgb A1c MFr Bld: 5.7 % — ABNORMAL HIGH (ref 4.8–5.6)

## 2022-12-23 ENCOUNTER — Other Ambulatory Visit: Payer: Self-pay

## 2022-12-23 MED ORDER — SIMVASTATIN 40 MG PO TABS: ORAL_TABLET | ORAL | 3 refills | Status: DC

## 2022-12-28 ENCOUNTER — Other Ambulatory Visit: Payer: Self-pay

## 2022-12-28 MED ORDER — METOPROLOL TARTRATE 25 MG PO TABS: 25.0000 mg | ORAL_TABLET | Freq: Two times a day (BID) | ORAL | 3 refills | Status: DC

## 2022-12-30 ENCOUNTER — Ambulatory Visit (HOSPITAL_COMMUNITY): Payer: BC Managed Care – PPO

## 2023-01-11 ENCOUNTER — Telehealth (HOSPITAL_COMMUNITY): Payer: Self-pay | Admitting: *Deleted

## 2023-01-11 NOTE — Telephone Encounter (Signed)
Reaching out to patient to offer assistance regarding upcoming cardiac imaging study; pt verbalizes understanding of appt date/time, parking situation and where to check in, pre-test NPO status  and verified current allergies; name and call back number provided for further questions should they arise  Shaneeka Scarboro RN Navigator Cardiac Imaging [facility] [phone] office [phone] cell  Patient aware to avoid caffeine 12 hours prior to her cardiac PET scan. 

## 2023-01-13 ENCOUNTER — Encounter (HOSPITAL_COMMUNITY)
Admission: RE | Admit: 2023-01-13 | Discharge: 2023-01-13 | Disposition: A | Discharge status: Home / Self Care | Payer: BC Managed Care – PPO | Source: Ambulatory Visit | Attending: Cardiology | Admitting: Cardiology

## 2023-01-13 DIAGNOSIS — I2510 Atherosclerotic heart disease of native coronary artery without angina pectoris: Secondary | ICD-10-CM | POA: Diagnosis present

## 2023-01-13 DIAGNOSIS — Z01818 Encounter for other preprocedural examination: Secondary | ICD-10-CM | POA: Diagnosis present

## 2023-01-13 DIAGNOSIS — E782 Mixed hyperlipidemia: Secondary | ICD-10-CM | POA: Insufficient documentation

## 2023-01-13 DIAGNOSIS — Z79899 Other long term (current) drug therapy: Secondary | ICD-10-CM | POA: Diagnosis present

## 2023-01-13 LAB — NM PET CT CARDIAC PERFUSION MULTI W/ABSOLUTE BLOODFLOW
LV dias vol: 74 mL (ref 46–106)
MBFR: 2.37
Peak HR: 103 {beats}/min
Rest HR: 74 {beats}/min
Rest MBF: 1.11 ml/g/min
Rest Nuclear Isotope Dose: 29.3 mCi
Rest perfusion cavity size (mL): 74 mL
ST Depression (mm): 0 mm
Stress MBF: 2.63 ml/g/min
Stress Nuclear Isotope Dose: 29.3 mCi
Stress perfusion cavity size (mL): 79 mL
TID: 1.03

## 2023-01-13 MED ORDER — RUBIDIUM RB82 GENERATOR (RUBYFILL)
30.0000 | PACK | Freq: Once | INTRAVENOUS | Status: AC
  Administered 2023-01-13: 30 via INTRAVENOUS

## 2023-01-13 MED ORDER — REGADENOSON 0.4 MG/5ML IV SOLN
INTRAVENOUS | Status: AC
  Filled 2023-01-13: qty 5

## 2023-01-13 MED ORDER — REGADENOSON 0.4 MG/5ML IV SOLN
0.4000 mg | Freq: Once | INTRAVENOUS | Status: AC
  Administered 2023-01-13: 0.4 mg via INTRAVENOUS

## 2023-01-21 ENCOUNTER — Telehealth: Payer: Self-pay | Admitting: Cardiology

## 2023-01-21 NOTE — Telephone Encounter (Signed)
Thank you Dr. Shari Prows for your input. I will be happy to fax your notes once completed.

## 2023-01-21 NOTE — Telephone Encounter (Signed)
Dr. Shari Prows made an amendment to her ov notes for pre op clearance. I will fax notes to Geradine Girt, NP fax # [phone].

## 2023-01-21 NOTE — Telephone Encounter (Signed)
Caller wants confirmation patient has been cleared for surgery after PET scan test.

## 2023-01-21 NOTE — Telephone Encounter (Signed)
   Pre-operative Risk Assessment    Patient Name: Adrienne [name]  DOB: [birth year] MRN: [id number]    Geradine Girt, NP HAS CALLED CHECKING IF PT HAS BEEN CLEARED SINCE PET SCAN HAS BEEN COMPLETED. I STATED I WILL ADDRESS WITH DR. Shari Prows. IF PT HAS BEEN CLEARED, WILL HAVE DR. Shari Prows UPDATE HER NOTES THAT PT HAS BEEN CLEARED OK TO PROCEED. I WILL THEN FAX NOTES TO Geradine Girt, NP FAX # [phone]  Request for Surgical Clearance    Procedure:   ROBOTIC LEFT ADRENALECTOMY , possible open  Date of Surgery:  Clearance TBD                                 Surgeon:  DR. Cordelia Pen Surgeon's Group or Practice Name:  [facility] Phone number:  [phone] ATTN: Geradine Girt, NP Fax number:  [phone]   Type of Clearance Requested:   - Medical ; PER NP NO MEDICATIONS ARE NEEDING TO BE HELD   Type of Anesthesia:  General    Additional requests/questions:    [name]   01/21/2023, 1:56 PM

## 2023-05-04 ENCOUNTER — Telehealth: Payer: Self-pay | Admitting: Cardiology

## 2023-05-04 NOTE — Telephone Encounter (Signed)
Pt has some questions about her medication regimen   She is a previous [facility] pt

## 2023-05-04 NOTE — Telephone Encounter (Signed)
Returned call to patient.   She's states she had to have an adrenal mass removed. No results yet. When she was in the hospital, they were giving her metoprolol, she states a nurse asked her why she was taking metoprolol since her hr and bp were low in the hospital. She states her hr was in the 40s in the hospital, but has come back up.   137/75 66- today  Monday- 139/85  Patient will check her BP & HR for the next few days to evaluate further is need to change her metoprolol.

## 2023-05-21 IMAGING — MG MM DIGITAL SCREENING BILAT W/ TOMO AND CAD
8 of 15 series · 8 of 40 positions shown · non-contrast
Comparison: Previous exam(s).

ACR Breast Density Category a: The breast tissue is almost entirely
fatty.

CLINICAL DATA: Screening.

EXAM:
DIGITAL SCREENING BILATERAL MAMMOGRAM WITH TOMOSYNTHESIS AND CAD
TECHNIQUE: Bilateral screening digital craniocaudal and mediolateral oblique
mammograms were obtained. Bilateral screening digital breast
tomosynthesis was performed. The images were evaluated with
computer-aided detection.

[R CV synth-2D]
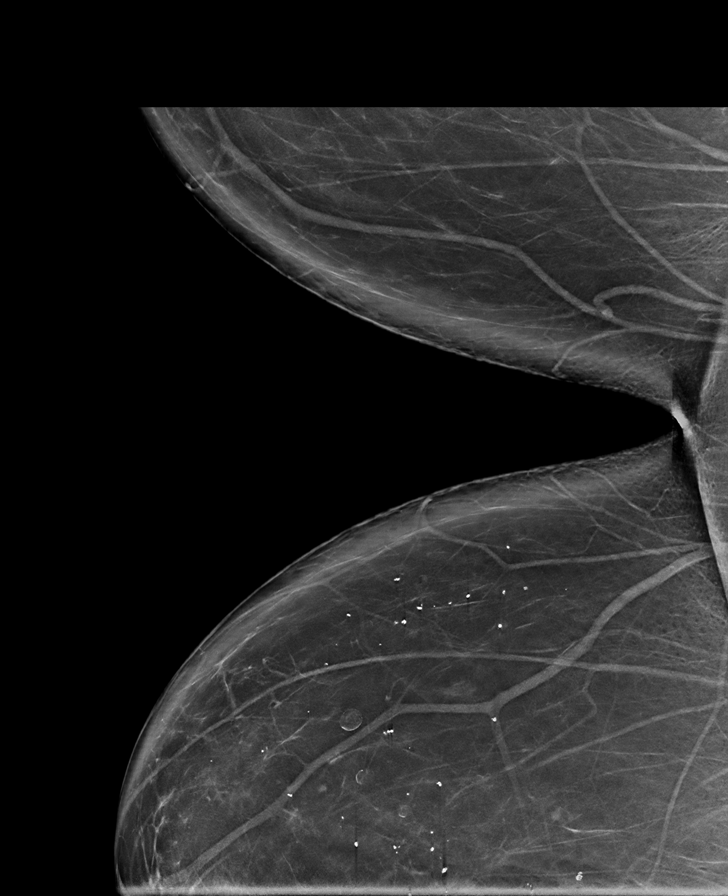

[L MLO synth-2D (1 of 2)]
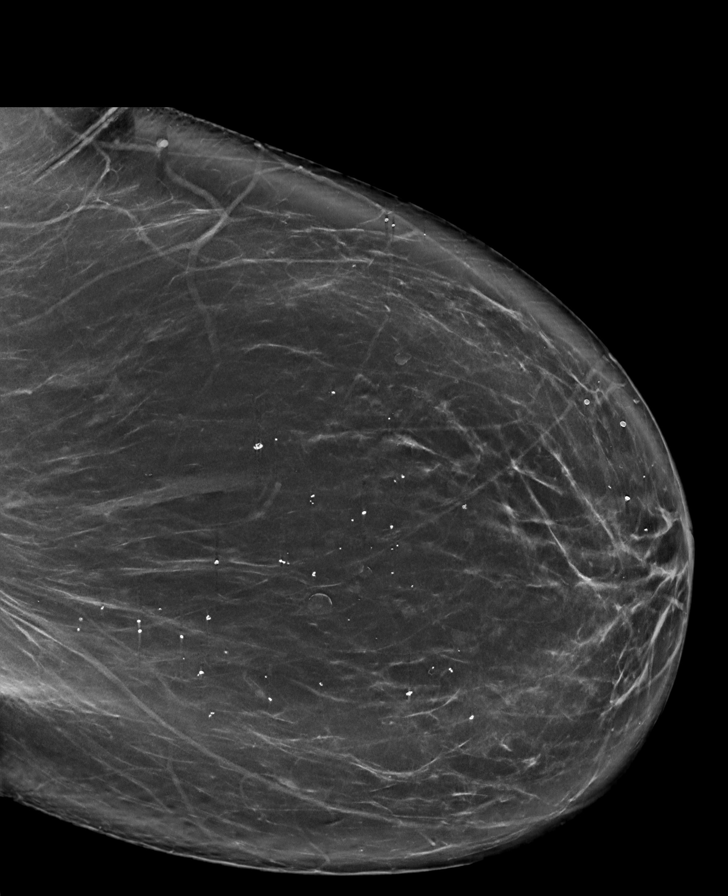

[R MLO synth-2D (1 of 2)]
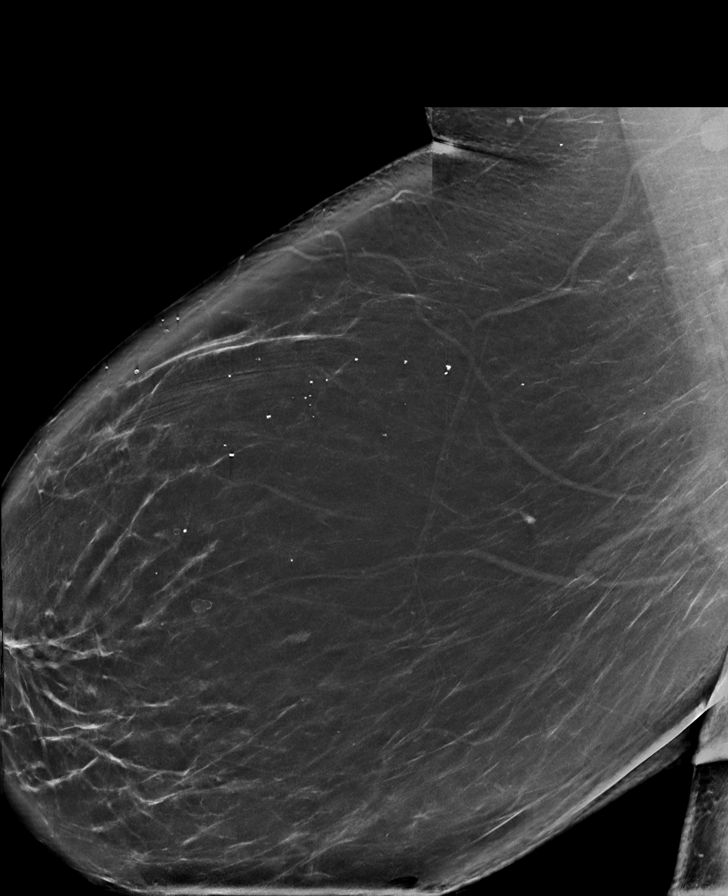

[R MLO synth-2D (2 of 2)]
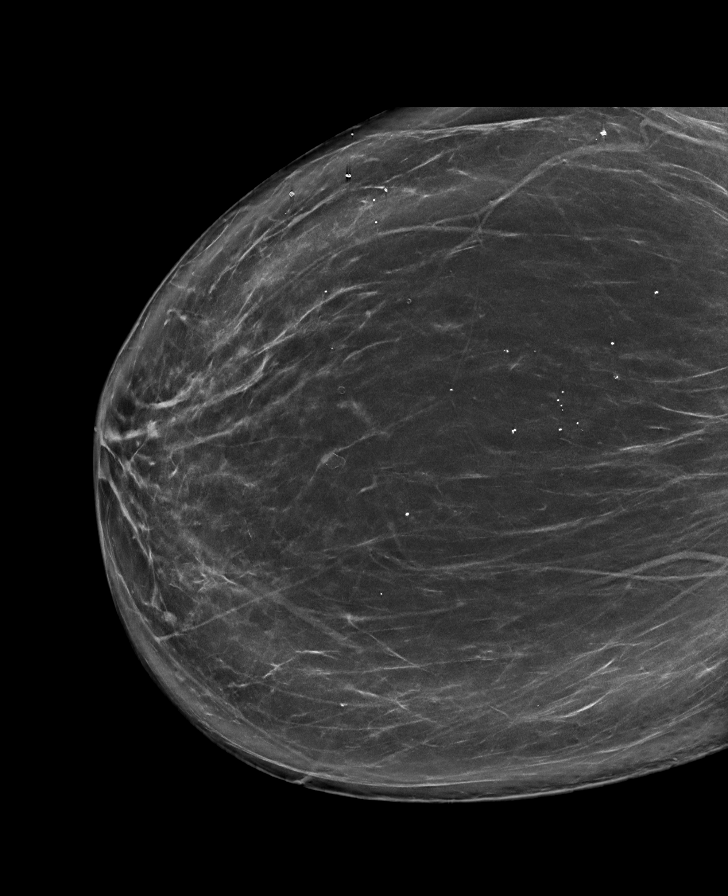

[R CC synth-2D (1 of 2)]
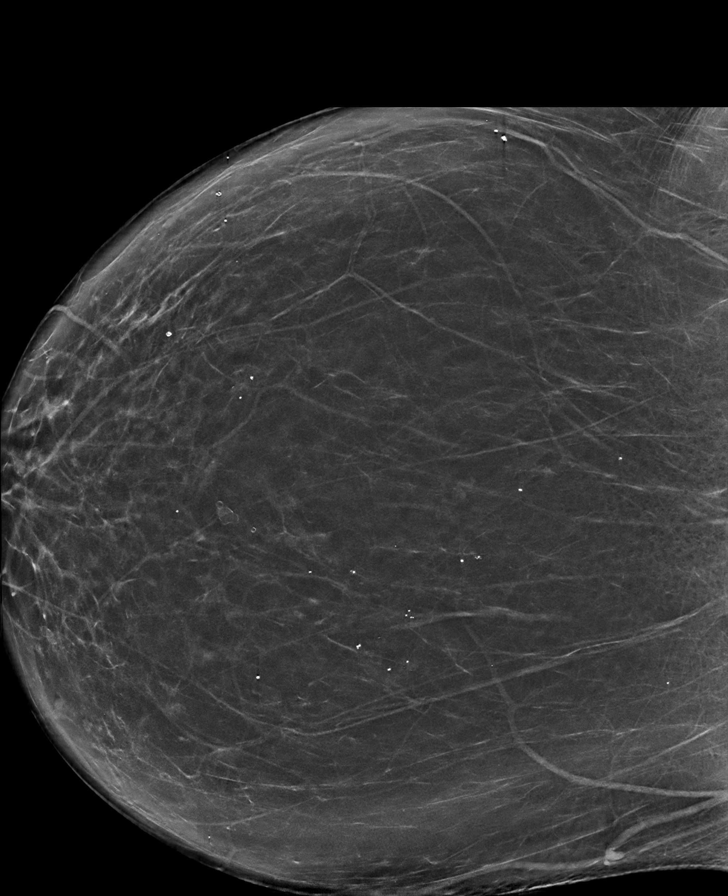

[L MLO synth-2D (2 of 2)]
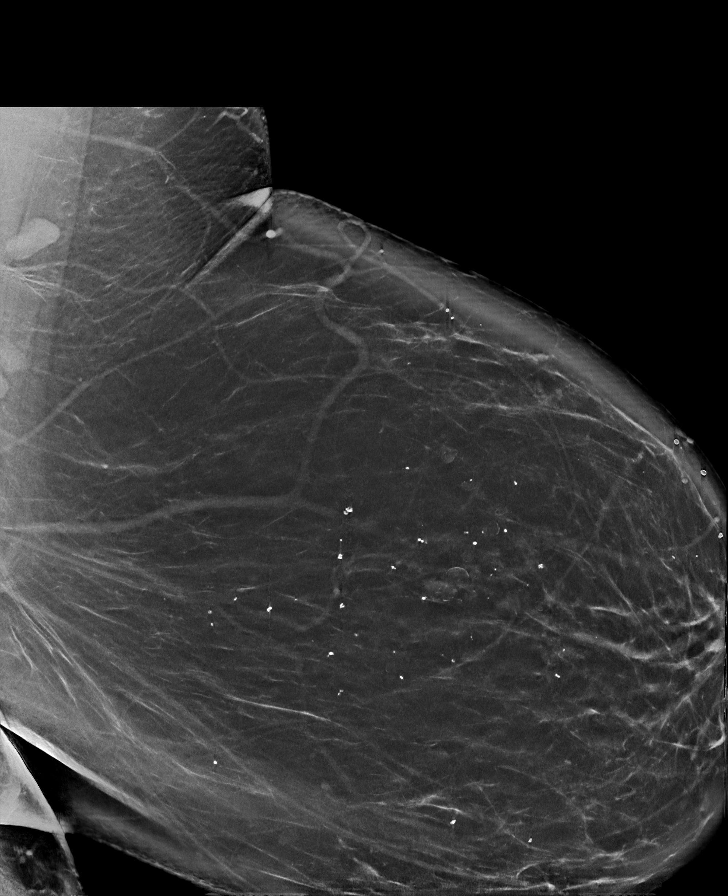

[R CC synth-2D (2 of 2)]
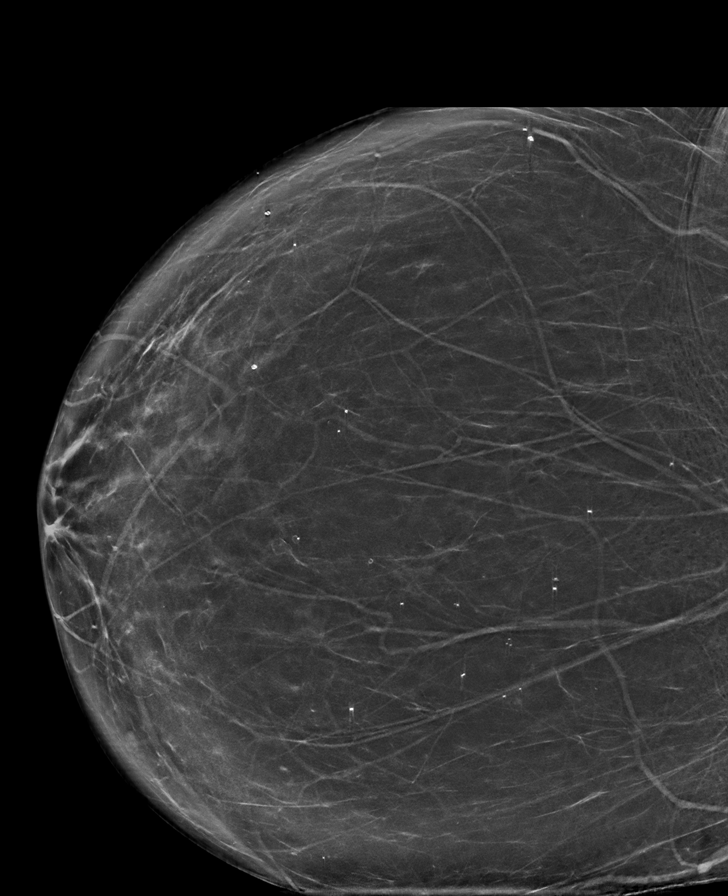

[R CV tomo · tomo slice 62/91.0]
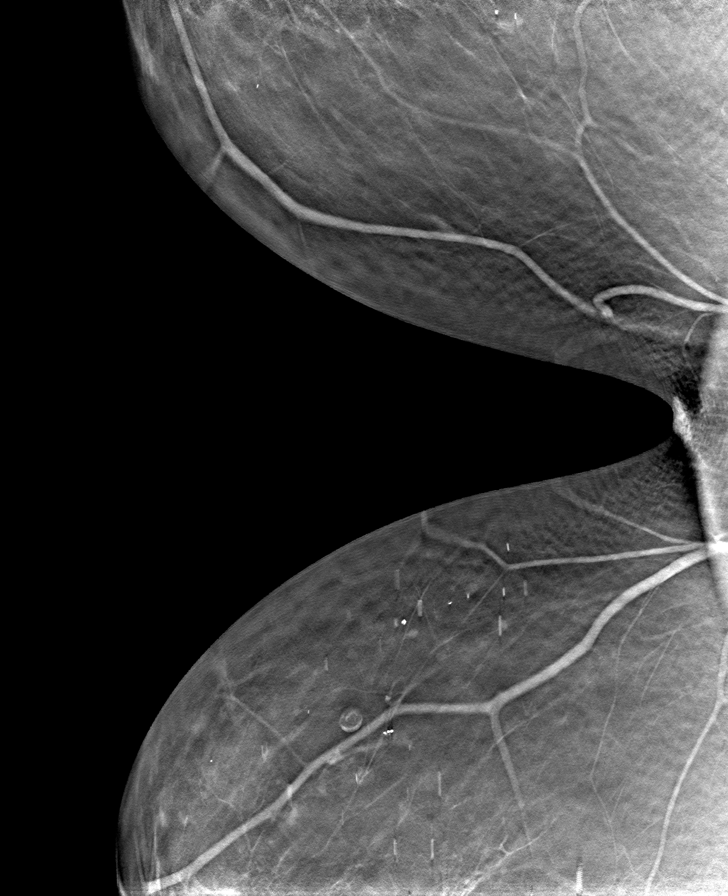

[8 of 40 positions shown; findings below may reference images not displayed]

FINDINGS: There are no findings suspicious for malignancy.
IMPRESSION: No mammographic evidence of malignancy. A result letter of this
screening mammogram will be mailed directly to the patient.

RECOMMENDATION:
Screening mammogram in one year. (Code:[id number]-[id number])

BI-RADS CATEGORY  1: Negative.

## 2023-06-07 ENCOUNTER — Other Ambulatory Visit: Payer: Self-pay | Admitting: Plastic Surgery

## 2023-06-07 DIAGNOSIS — Z1231 Encounter for screening mammogram for malignant neoplasm of breast: Secondary | ICD-10-CM

## 2023-06-08 ENCOUNTER — Other Ambulatory Visit: Payer: Self-pay

## 2023-06-08 MED ORDER — NITROGLYCERIN 0.4 MG SL SUBL: 0.4000 mg | SUBLINGUAL_TABLET | SUBLINGUAL | 1 refills | Status: AC | PRN

## 2023-07-01 ENCOUNTER — Ambulatory Visit: Payer: BC Managed Care – PPO

## 2023-07-08 ENCOUNTER — Telehealth (HOSPITAL_BASED_OUTPATIENT_CLINIC_OR_DEPARTMENT_OTHER): Payer: Self-pay | Admitting: Cardiology

## 2023-07-08 MED ORDER — ICOSAPENT ETHYL 1 G PO CAPS: 2.0000 g | ORAL_CAPSULE | Freq: Two times a day (BID) | ORAL | 3 refills | Status: DC

## 2023-07-08 NOTE — Telephone Encounter (Signed)
*  STAT* If patient is at the pharmacy, call can be transferred to refill team.   1. Which medications need to be refilled? (please list name of each medication and dose if known)   icosapent Ethyl (VASCEPA) 1 g capsule   2. Would you like to learn more about the convenience, safety, & potential cost savings by using the [facility]?   3. Are you open to using the [facility] (Type [facility]. ).  4. Which pharmacy/location (including street and city if local pharmacy) is medication to be sent to?  [facility] [address] [place], [place] - [address]   5. Do they need a 30 day or 90 day supply?   90 day  Patient stated she has 2 days left of this medication.

## 2023-07-21 ENCOUNTER — Ambulatory Visit: Payer: BC Managed Care – PPO

## 2023-07-27 ENCOUNTER — Telehealth: Payer: Self-pay | Admitting: Cardiology

## 2023-07-27 MED ORDER — REPATHA SURECLICK 140 MG/ML ~~LOC~~ SOAJ: SUBCUTANEOUS | 0 refills | Status: DC

## 2023-07-27 NOTE — Telephone Encounter (Signed)
*  STAT* If patient is at the pharmacy, call can be transferred to refill team.   1. Which medications need to be refilled? (please list name of each medication and dose if known)   Evolocumab (REPATHA SURECLICK) 140 MG/ML SOAJ   2. Which pharmacy/location (including street and city if local pharmacy) is medication to be sent to? [facility] [address] [place], [place] - [address] Phone: [phone]  Fax: [phone]     3. Do they need a 30 day or 90 day supply? 90

## 2023-07-29 ENCOUNTER — Other Ambulatory Visit (HOSPITAL_COMMUNITY): Payer: Self-pay

## 2023-07-29 ENCOUNTER — Telehealth: Payer: Self-pay | Admitting: Pharmacy Technician

## 2023-07-29 DIAGNOSIS — E782 Mixed hyperlipidemia: Secondary | ICD-10-CM

## 2023-07-29 DIAGNOSIS — I2510 Atherosclerotic heart disease of native coronary artery without angina pectoris: Secondary | ICD-10-CM

## 2023-07-29 NOTE — Telephone Encounter (Signed)
Pharmacy Patient Advocate Encounter   Received notification from Pt Calls Messages that prior authorization for repatha is required/requested.   Insurance verification completed.   The patient is insured through [facility] .   Per test claim: PA required; PA submitted to above mentioned insurance via CoverMyMeds Key/confirmation #/EOC [id number] Status is pending

## 2023-07-29 NOTE — Telephone Encounter (Signed)
Please complete PA for Repatha 

## 2023-07-30 ENCOUNTER — Other Ambulatory Visit (HOSPITAL_COMMUNITY): Payer: Self-pay

## 2023-07-30 MED ORDER — REPATHA SURECLICK 140 MG/ML ~~LOC~~ SOAJ: SUBCUTANEOUS | 1 refills | Status: AC

## 2023-07-30 NOTE — Telephone Encounter (Signed)
Pharmacy Patient Advocate Encounter  Received notification from [facility] that Prior Authorization for repatha has been APPROVED from 07/29/23 to 07/28/24. Ran test claim, Copay is $0.00- 3 months. This test claim was processed through [facility]- copay amounts may vary at other pharmacies due to pharmacy/plan contracts, or as the patient moves through the different stages of their insurance plan.   PA #/Case ID/Reference #: [id number]

## 2023-07-30 NOTE — Addendum Note (Signed)
Addended by: Cheree Ditto on: 07/30/2023 01:55 PM   Modules accepted: Orders

## 2023-08-20 ENCOUNTER — Ambulatory Visit
Admission: RE | Admit: 2023-08-20 | Discharge: 2023-08-20 | Disposition: A | Discharge status: Home / Self Care | Payer: BC Managed Care – PPO | Source: Ambulatory Visit | Attending: Plastic Surgery | Admitting: Plastic Surgery

## 2023-08-20 DIAGNOSIS — Z1231 Encounter for screening mammogram for malignant neoplasm of breast: Secondary | ICD-10-CM

## 2023-09-08 ENCOUNTER — Other Ambulatory Visit (HOSPITAL_COMMUNITY): Payer: Self-pay

## 2023-09-08 ENCOUNTER — Telehealth: Payer: Self-pay | Admitting: Pharmacy Technician

## 2023-09-08 NOTE — Telephone Encounter (Signed)
 Pharmacy Patient Advocate Encounter   Received notification from CoverMyMeds that prior authorization for Vascepa  1GM capsules is required/requested.   Insurance verification completed.   The patient is insured through [facility] .   Per test claim: Refill too soon. PA is not needed at this time. Medication was filled 07/08/23. Next eligible fill date is 09/14/23.

## 2023-12-20 ENCOUNTER — Other Ambulatory Visit: Payer: Self-pay

## 2023-12-20 MED ORDER — METOPROLOL TARTRATE 25 MG PO TABS: 25.0000 mg | ORAL_TABLET | Freq: Two times a day (BID) | ORAL | 0 refills | Status: DC

## 2023-12-22 ENCOUNTER — Telehealth (HOSPITAL_BASED_OUTPATIENT_CLINIC_OR_DEPARTMENT_OTHER): Payer: Self-pay | Admitting: Cardiology

## 2023-12-22 MED ORDER — SIMVASTATIN 40 MG PO TABS: ORAL_TABLET | ORAL | 0 refills | Status: DC

## 2023-12-22 NOTE — Telephone Encounter (Signed)
*  STAT* If patient is at the pharmacy, call can be transferred to refill team.   1. Which medications need to be refilled? (please list name of each medication and dose if known)   simvastatin  (ZOCOR ) 40 MG tablet   2. Would you like to learn more about the convenience, safety, & potential cost savings by using the [facility]?   3. Are you open to using the [facility] (Type [facility]. ).  4. Which pharmacy/location (including street and city if local pharmacy) is medication to be sent to?  [id number] - [place], [place] - [address]   5. Do they need a 30 day or 90 day supply?   90 day  Patient stated she only has a couple of days left of this medication.

## 2024-01-17 ENCOUNTER — Other Ambulatory Visit (HOSPITAL_BASED_OUTPATIENT_CLINIC_OR_DEPARTMENT_OTHER): Payer: Self-pay | Admitting: Cardiology

## 2024-01-21 ENCOUNTER — Telehealth: Payer: Self-pay | Admitting: General Practice

## 2024-01-21 ENCOUNTER — Other Ambulatory Visit (HOSPITAL_BASED_OUTPATIENT_CLINIC_OR_DEPARTMENT_OTHER): Payer: Self-pay | Admitting: Cardiology

## 2024-01-21 MED ORDER — SIMVASTATIN 40 MG PO TABS: ORAL_TABLET | ORAL | 0 refills | Status: DC

## 2024-01-21 NOTE — Telephone Encounter (Signed)
 Pt's medication was sent to pt's pharmacy as requested. Confirmation received.

## 2024-01-21 NOTE — Telephone Encounter (Signed)
*  STAT* If patient is at the pharmacy, call can be transferred to refill team.   1. Which medications need to be refilled? (please list name of each medication and dose if known)  simvastatin  (ZOCOR ) 40 MG tablet   2. Which pharmacy/location (including street and city if local pharmacy) is medication to be sent to? [id number] - [place], [place] - [address]   3. Do they need a 30 day or 90 day supply? 90 day supply if possible. Patient is completely out of medication.

## 2024-02-02 NOTE — Progress Notes (Signed)
 Cardiology Clinic Note   Patient Name: Adrienne [name] Date of Encounter: 02/04/2024  Primary Care Provider:  Emaline Handsome, MD Primary Cardiologist:  None  Patient Profile    Adrienne [name] 53 year old female presents the clinic today for follow-up evaluation of her coronary artery disease and hyperlipidemia.  Past Medical History    Past Medical History:  Diagnosis Date   CAD (coronary artery disease) 03/18/2018   S/p NSTEMI 7/19: LHC - oLAD 50, mLCx 50, oRCA 90 >> PCI:  DES to [facility] // Echo 7/19:  Mild concentric LVH, EF 65-70, normal wall motion, normal diastolic function, trivial TR   Coronary artery disease    GERD (gastroesophageal reflux disease)    Hyperlipidemia    Morbid obesity (HCC) 07/29/2018   OSA (obstructive sleep apnea) 07/29/2018   Severe OSA with an AHI of 48/hr and no central sleep apnea.  Her O2 sats dropped to 77% with respiratory events. She is now on auto CPAP   Past Surgical History:  Procedure Laterality Date   CARDIAC CATHETERIZATION  02/25/2018   CESAREAN SECTION     CORONARY STENT INTERVENTION N/A 02/25/2018   Procedure: CORONARY STENT INTERVENTION;  Surgeon: Adrienne Lapping, MD;  Location: [facility] INVASIVE CV LAB;  Service: Cardiovascular;  Laterality: N/A;   finger surgery     LEFT HEART CATH AND CORONARY ANGIOGRAPHY N/A 02/23/2018   Procedure: LEFT HEART CATH AND CORONARY ANGIOGRAPHY;  Surgeon: Adrienne Benne, MD;  Location: [facility] INVASIVE CV LAB;  Service: Cardiovascular;  Laterality: N/A;    Allergies  Allergies  Allergen Reactions   Latex Itching and Rash    Latex allergy is to condoms, but gloves are tolerated Latex allergy is to condoms, but gloves are tolerated   Ciprofloxacin Swelling    Tongue and lips swell   Amoxicillin Hives    History of Present Illness    Adrienne [name] has a PMH of coronary artery disease with LHC and PCI to her ostial RCA 7/19, OSA on CPAP, morbid obesity, and hyperlipidemia.  She was  followed by Dr. Felipe [name].  She was last seen by Dr. Ardell [name] on 12/09/2022.  During that time she remained stable from a cardiac standpoint.  She was planning to have a surgery for removal of adrenal mass.  She presented for follow-up and preoperative cardiac evaluation.  She noted chronic pain that limited her mobility.  She denied exertional chest pain or significant dyspnea.  She was not noted to have orthopnea or lower extremity swelling.  She reported that her mother had difficulty with a stress test once and she was hoping to avoid stress testing.  She agreed to PET scan.  Her PET scan showed mild reduction in counts in the anterior wall with mid segmental to apex that was reversible.  Overall it was felt that her study was low risk.  It was felt that her study was normal.  She presents to the clinic today for follow-up evaluation and states she has been having trouble with kidney stones.  She notes that they are composed of calcium .  She has been working with endocrinology to help with steroid treatment postoperatively.  She reports that she does not like to be on steroid treatment and has done better with reduced steroid doses.  She continues to have back pain.  She does note that she had some chest discomfort over Memorial weekend.  She thought that she had about 4-5 episodes.  Her pain was sharp, lasted for seconds  and dissipated on its own.  We reviewed her cardiac PET and she expressed understanding.  I will repeat her fasting lipids and LFTs in 1 week and plan follow-up in 12 months.  Today she denies  shortness of breath, lower extremity edema, fatigue, palpitations, melena, hematuria, hemoptysis, diaphoresis, weakness, presyncope, syncope, orthopnea, and PND.   Home Medications    Prior to Admission medications   Medication Sig Start Date End Date Taking? Authorizing Provider  ALPRAZolam  (XANAX ) 1 MG tablet Take 0.5-1 mg by mouth 3 (three) times daily as needed for anxiety.  02/22/18    [provider]  Ascorbic Acid (VITAMIN C) 1000 MG tablet Take 1 tablet by mouth daily.    [provider]  aspirin  81 MG tablet Take 81 mg by mouth daily.    [provider]  Cholecalciferol (VITAMIN D3) 250 MCG (10000 UT) capsule Take 10,000 Units by mouth daily.    [provider]  Evolocumab  (REPATHA  SURECLICK) 140 MG/ML SOAJ INJECT 1 PEN INTO THE SKIIN EVERY 14 DAYS 07/30/23   Adrienne Donning, MD  fluticasone (FLONASE) 50 MCG/ACT nasal spray Place 1 spray into both nostrils as needed for allergies.     [provider]  HYDROcodone -acetaminophen  (NORCO) 5-325 MG tablet Take 0.5-1 tablets by mouth every 4 (four) hours as needed. 04/22/22   Adrienne Montenegro, MD  ibuprofen (ADVIL,MOTRIN) 200 MG tablet Take 600-800 mg by mouth every 6 (six) hours as needed (for pain, headaches, or cramps).    [provider]  icosapent  Ethyl (VASCEPA ) 1 g capsule Take 2 capsules (2 g total) by mouth 2 (two) times daily. 07/08/23   Adrienne Donning, MD  metoprolol  tartrate (LOPRESSOR ) 25 MG tablet Take 1 tablet (25 mg total) by mouth 2 (two) times daily. Must keep 6/13 appointment for further refills 01/21/24   Adrienne Donning, MD  nitroGLYCERIN  (NITROSTAT ) 0.4 MG SL tablet Place 1 tablet (0.4 mg total) under the tongue every 5 (five) minutes as needed for chest pain. 06/08/23   Adrienne Matsu, MD  omeprazole (PRILOSEC) 40 MG capsule Take 40 mg by mouth daily. 07/13/18   [provider]  ranitidine (ZANTAC) 150 MG capsule Take 150 mg by mouth as needed for heartburn.    [provider]  simvastatin  (ZOCOR ) 40 MG tablet TAKE 1 TABLET BY MOUTH AT BEDTIME. 01/21/24   Adrienne Charity, NP    Family History    Family History  Problem Relation Age of Onset   CAD Mother    She indicated that the status of her mother is unknown.  Social History    Social History   Socioeconomic History   Marital status: Married    Spouse  name: Not on file   Number of children: Not on file   Years of education: Not on file   Highest education level: Not on file  Occupational History   Not on file  Tobacco Use   Smoking status: Former    Current packs/day: 0.00    Types: Cigarettes    Quit date: 02/22/2018    Years since quitting: 5.9   Smokeless tobacco: Never  Vaping Use   Vaping status: Never Used  Substance and Sexual Activity   Alcohol use: No   Drug use: No   Sexual activity: Not on file  Other Topics Concern   Not on file  Social History Narrative   Not on file   Social Drivers of Health   Financial Resource Strain: Medium Risk (12/12/2023)  Received from [employer]   Overall Financial Resource Strain (CARDIA)    Difficulty of Paying Living Expenses: Somewhat hard  Food Insecurity: Food Insecurity Present (12/12/2023)   Received from [facility]   Hunger Vital Sign    Within the past 12 months, you worried that your food would run out before you got the money to buy more.: Sometimes true    Within the past 12 months, the food you bought just didn't last and you didn't have money to get more.: Patient declined  Transportation Needs: No Transportation Needs (12/12/2023)   Received from [facility] - Transportation    Lack of Transportation (Medical): No    Lack of Transportation (Non-Medical): No  Physical Activity: Unknown (12/12/2023)   Received from [facility]   Exercise Vital Sign    On average, how many days per week do you engage in moderate to strenuous exercise (like a brisk walk)?: 0 days    Minutes of Exercise per Session: Not on file  Stress: Stress Concern Present (12/12/2023)   Received from [facility] - Occupational Stress Questionnaire    Feeling of Stress : Rather much  Social Connections: Socially Integrated (12/12/2023)   Received from [facility]   Social Network    How would you rate your social network (family,  work, friends)?: Good participation with social networks  Intimate Partner Violence: Not At Risk (12/12/2023)   Received from [facility]   HITS    Over the last 12 months how often did your partner physically hurt you?: Never    Over the last 12 months how often did your partner insult you or talk down to you?: Sometimes    Over the last 12 months how often did your partner threaten you with physical harm?: Never    Over the last 12 months how often did your partner scream or curse at you?: Sometimes     Review of Systems    General:  No chills, fever, night sweats or weight changes.  Cardiovascular:  No chest pain, dyspnea on exertion, edema, orthopnea, palpitations, paroxysmal nocturnal dyspnea. Dermatological: No rash, lesions/masses Respiratory: No cough, dyspnea Urologic: No hematuria, dysuria Abdominal:   No nausea, vomiting, diarrhea, bright red blood per rectum, melena, or hematemesis Neurologic:  No visual changes, wkns, changes in mental status. All other systems reviewed and are otherwise negative except as noted above.  Physical Exam    VS:  BP 118/66 (BP Location: Left Arm, Patient Position: Sitting, Cuff Size: Large)   Pulse 73   Ht 5' 4 (1.626 m)   Wt 271 lb 6.4 oz (123.1 kg)   SpO2 94%   BMI 46.59 kg/m  , BMI Body mass index is 46.59 kg/m. GEN: Well nourished, well developed, in no acute distress. HEENT: normal. Neck: Supple, no JVD, carotid bruits, or masses. Cardiac: RRR, no murmurs, rubs, or gallops. No clubbing, cyanosis, generalized bilateral lower extremity edema.  Radials/DP/PT 2+ and equal bilaterally.  Respiratory:  Respirations regular and unlabored, clear to auscultation bilaterally. GI: Soft, nontender, nondistended, BS + x 4. MS: no deformity or atrophy. Skin: warm and dry, no rash. Neuro:  Strength and sensation are intact. Psych: Normal affect.  Accessory Clinical Findings    Recent Labs: No results found for requested labs within last  365 days.   Recent Lipid Panel    Component Value Date/Time   CHOL 150 12/14/2022 1539   TRIG 82 12/14/2022 1539  HDL 71 12/14/2022 1539   CHOLHDL 2.1 12/14/2022 1539   CHOLHDL 3.4 02/26/2018 0417   VLDL 28 02/26/2018 0417   LDLCALC 63 12/14/2022 1539         ECG personally reviewed by me today- EKG Interpretation Date/Time:  Friday February 04 2024 14:36:31 EDT Ventricular Rate:  62 PR Interval:  170 QRS Duration:  88 QT Interval:  416 QTC Calculation: 422 R Axis:   13  Text Interpretation: Normal sinus rhythm Low voltage QRS Cannot rule out Anterior infarct (cited on or before 26-Feb-2018) When compared with ECG of 26-Feb-2018 04:32, No significant change was found Confirmed by Lawana Pray [phone]) on 02/04/2024 2:41:53 PM   Cardiac PET 01/13/2023  Mild reduction in counts in the anterior wall (medium size), mid segment to apex, that is reversible. MBF is normal in this region which suggests this is likely artifact. Overall, suspect this is a normal study that is low-risk.   LV perfusion is abnormal. There is evidence of ischemia. Defect 1: There is a medium defect with mild reduction in uptake present in the apical to mid anterior and apex location(s) that is reversible. There is normal wall motion in the defect area. Consistent with artifact.   Rest left ventricular function is normal. Stress left ventricular function is normal. End diastolic cavity size is normal.   Myocardial blood flow was computed to be 1.21ml/g/min at rest and 2.81ml/g/min at stress. Global myocardial blood flow reserve was 2.37 and was normal.   Coronary calcium  assessment not performed due to prior revascularization.   The study is normal. The study is low risk.   Electronically signed by Jackquelyn Mass, MD _____________________________________________________________________________________________________       Assessment & Plan   1.  Coronary artery disease-denies anginal type symptoms.  No  recent exertional chest discomfort.  Cardiac PET showed low risk and was noted to be normal.  Does note that over Memorial weekend she had some sharp chest discomfort that lasted for seconds and dissipated with rest.  Pain does not appear to be cardiac related.  We discussed precordial chest pain. Continue aspirin , Repatha , Vascepa , simvastatin , metoprolol , nitroglycerin  as needed Heart healthy low-sodium diet Increase physical activity as tolerated  Hyperlipidemia-LDL 63 on 12/14/22. Has not been taking repatha . High-fiber diet Continue Vascepa , Repatha , simvastatin  Repeat fasting lipids and LFTs  Morbid obesity-weight today 271.4. Reduced calorie diet Increase physical activity as tolerated Continue weight loss Continue to work with endocrinology \ Disposition: Follow-up with Dr. Micael Adas or me in 12 months.   Chet Cota. Lalani Winkles NP-C     02/04/2024, 3:00 PM [facility] [address] Office [phone] Fax [phone]    I spent 14 minutes examining this patient, reviewing medications, and using patient centered shared decision making involving their cardiac care.   I spent  20 minutes reviewing past medical history,  medications, and prior cardiac tests.

## 2024-02-03 ENCOUNTER — Encounter (HOSPITAL_BASED_OUTPATIENT_CLINIC_OR_DEPARTMENT_OTHER): Payer: Self-pay | Admitting: Cardiology

## 2024-02-03 NOTE — Telephone Encounter (Signed)
 error

## 2024-02-04 ENCOUNTER — Encounter: Payer: Self-pay | Admitting: General Practice

## 2024-02-04 ENCOUNTER — Ambulatory Visit: Attending: General Practice | Admitting: General Practice

## 2024-02-04 VITALS — BP 118/66 | HR 73 | Ht 64.0 in | Wt 271.4 lb

## 2024-02-04 DIAGNOSIS — I2510 Atherosclerotic heart disease of native coronary artery without angina pectoris: Secondary | ICD-10-CM

## 2024-02-04 DIAGNOSIS — E782 Mixed hyperlipidemia: Secondary | ICD-10-CM

## 2024-02-04 NOTE — Patient Instructions (Signed)
 Medication Instructions:  Your physician recommends that you continue on your current medications as directed. Please refer to the Current Medication list given to you today.  *If you need a refill on your cardiac medications before your next appointment, please call your pharmacy*  Lab Work: Fasting Lipids and LFTs-- Next week  You may go to any [facility] Location for your lab work:  [facility] - [address] ([facility]) - [address] [phone] [address]  [place] - [address]   [place]  - [address]   [place] - [address] - [address] [employer]  - [address] [place] - [address] ([facility]   If you have labs (blood work) drawn today and your tests are completely normal, you will receive your results only by: [employer] (if you have MyChart)  If you have any lab test that is abnormal or we need to change your treatment, we will call you or send a MyChart message to review the results.  Testing/Procedures: None ordered.  Follow-Up: At [facility], you and your health needs are our priority.  As part of our continuing mission to provide you with exceptional heart care, we have created designated Provider Care Teams.  These Care Teams include your primary Cardiologist (physician) and Advanced Practice Providers (APPs -  Physician Assistants and Nurse Practitioners) who all work together to provide you with the care you need, when you need it.  Your next appointment:   1 year(s)  The format for your next appointment:   In Person  Provider:   Lawana Pray, NP

## 2024-02-11 NOTE — Telephone Encounter (Signed)
 Are you okay with this?

## 2024-02-13 ENCOUNTER — Other Ambulatory Visit: Payer: Self-pay | Admitting: General Practice

## 2024-02-13 ENCOUNTER — Other Ambulatory Visit (HOSPITAL_BASED_OUTPATIENT_CLINIC_OR_DEPARTMENT_OTHER): Payer: Self-pay | Admitting: Cardiology

## 2024-03-08 LAB — LAB REPORT - SCANNED: EGFR: 104

## 2024-06-27 ENCOUNTER — Other Ambulatory Visit (HOSPITAL_BASED_OUTPATIENT_CLINIC_OR_DEPARTMENT_OTHER): Payer: Self-pay | Admitting: Cardiology

## 2024-07-31 ENCOUNTER — Other Ambulatory Visit: Payer: Self-pay | Admitting: Family Medicine

## 2024-07-31 DIAGNOSIS — Z1231 Encounter for screening mammogram for malignant neoplasm of breast: Secondary | ICD-10-CM

## 2024-08-25 ENCOUNTER — Ambulatory Visit

## 2024-09-07 ENCOUNTER — Ambulatory Visit

## 2024-09-20 ENCOUNTER — Ambulatory Visit

## 2024-10-03 ENCOUNTER — Ambulatory Visit
